# Patient Record
Sex: Female | Born: 1940 | Race: White | Hispanic: No | Smoking: Never smoker
Health system: Southern US, Community
[De-identification: ages and names within clinical notes are randomized; demographics above are authoritative.]

## PROBLEM LIST (undated history)

## (undated) DIAGNOSIS — R410 Disorientation, unspecified: Secondary | ICD-10-CM

## (undated) DIAGNOSIS — E7800 Pure hypercholesterolemia, unspecified: Secondary | ICD-10-CM

## (undated) DIAGNOSIS — R519 Headache, unspecified: Secondary | ICD-10-CM

## (undated) DIAGNOSIS — R51 Headache: Secondary | ICD-10-CM

## (undated) DIAGNOSIS — E039 Hypothyroidism, unspecified: Secondary | ICD-10-CM

## (undated) DIAGNOSIS — F329 Major depressive disorder, single episode, unspecified: Secondary | ICD-10-CM

## (undated) DIAGNOSIS — F32A Depression, unspecified: Secondary | ICD-10-CM

## (undated) HISTORY — DX: Depression, unspecified: F32.A

## (undated) HISTORY — PX: CATARACT EXTRACTION: SUR2

## (undated) HISTORY — DX: Pure hypercholesterolemia, unspecified: E78.00

## (undated) HISTORY — DX: Disorientation, unspecified: R41.0

## (undated) HISTORY — DX: Hypothyroidism, unspecified: E03.9

---
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
## 1999-07-12 ENCOUNTER — Other Ambulatory Visit: Admission: RE | Admit: 1999-07-12 | Discharge: 1999-07-12 | Payer: Self-pay | Admitting: Family Medicine

## 2001-11-13 ENCOUNTER — Other Ambulatory Visit: Admission: RE | Admit: 2001-11-13 | Discharge: 2001-11-13 | Payer: Self-pay | Admitting: Family Medicine

## 2002-05-18 ENCOUNTER — Ambulatory Visit (HOSPITAL_COMMUNITY): Admission: RE | Admit: 2002-05-18 | Discharge: 2002-05-18 | Payer: Self-pay | Admitting: Family Medicine

## 2002-05-18 ENCOUNTER — Encounter: Payer: Self-pay | Admitting: Family Medicine

## 2010-06-22 ENCOUNTER — Encounter: Admission: RE | Admit: 2010-06-22 | Discharge: 2010-06-22 | Payer: Self-pay | Admitting: Family Medicine

## 2011-04-18 ENCOUNTER — Other Ambulatory Visit: Payer: Self-pay | Admitting: Family Medicine

## 2011-04-18 DIAGNOSIS — M542 Cervicalgia: Secondary | ICD-10-CM

## 2011-04-21 ENCOUNTER — Ambulatory Visit
Admission: RE | Admit: 2011-04-21 | Discharge: 2011-04-21 | Disposition: A | Discharge status: Home / Self Care | Payer: Medicare Other | Source: Ambulatory Visit | Attending: Family Medicine | Admitting: Family Medicine

## 2011-04-21 DIAGNOSIS — M542 Cervicalgia: Secondary | ICD-10-CM

## 2013-12-02 ENCOUNTER — Other Ambulatory Visit: Payer: Self-pay | Admitting: Family Medicine

## 2013-12-02 ENCOUNTER — Ambulatory Visit
Admission: RE | Admit: 2013-12-02 | Discharge: 2013-12-02 | Disposition: A | Discharge status: Home / Self Care | Payer: Medicare Other | Source: Ambulatory Visit | Attending: Family Medicine | Admitting: Family Medicine

## 2013-12-02 DIAGNOSIS — M545 Low back pain, unspecified: Secondary | ICD-10-CM

## 2015-01-13 ENCOUNTER — Emergency Department (HOSPITAL_COMMUNITY): Payer: Medicare Other

## 2015-01-13 ENCOUNTER — Encounter (HOSPITAL_COMMUNITY): Payer: Self-pay

## 2015-01-13 ENCOUNTER — Emergency Department (HOSPITAL_COMMUNITY)
Admission: EM | Admit: 2015-01-13 | Discharge: 2015-01-13 | Disposition: A | Discharge status: Home / Self Care | Payer: Medicare Other | Attending: Emergency Medicine | Admitting: Emergency Medicine

## 2015-01-13 DIAGNOSIS — Y9389 Activity, other specified: Secondary | ICD-10-CM | POA: Insufficient documentation

## 2015-01-13 DIAGNOSIS — W19XXXA Unspecified fall, initial encounter: Secondary | ICD-10-CM

## 2015-01-13 DIAGNOSIS — W01198A Fall on same level from slipping, tripping and stumbling with subsequent striking against other object, initial encounter: Secondary | ICD-10-CM | POA: Insufficient documentation

## 2015-01-13 DIAGNOSIS — S0990XA Unspecified injury of head, initial encounter: Secondary | ICD-10-CM | POA: Diagnosis not present

## 2015-01-13 DIAGNOSIS — Y998 Other external cause status: Secondary | ICD-10-CM | POA: Insufficient documentation

## 2015-01-13 DIAGNOSIS — R42 Dizziness and giddiness: Secondary | ICD-10-CM | POA: Insufficient documentation

## 2015-01-13 DIAGNOSIS — F0781 Postconcussional syndrome: Secondary | ICD-10-CM | POA: Insufficient documentation

## 2015-01-13 DIAGNOSIS — Y9289 Other specified places as the place of occurrence of the external cause: Secondary | ICD-10-CM | POA: Diagnosis not present

## 2015-01-13 HISTORY — DX: Headache, unspecified: R51.9

## 2015-01-13 HISTORY — DX: Headache: R51

## 2015-01-13 LAB — CBG MONITORING, ED: Glucose-Capillary: 90 mg/dL (ref 70–99)

## 2015-01-13 MED ORDER — MECLIZINE HCL 12.5 MG PO TABS: 12.5000 mg | ORAL_TABLET | Freq: Two times a day (BID) | ORAL | Status: DC | PRN

## 2015-01-13 NOTE — ED Provider Notes (Signed)
I saw and evaluated the patient, reviewed the resident's note and I agree with the findings and plan.   EKG Interpretation None      Pt is a 74 y.o. female with no sciatica past medical history who is not on anticoagulation who presents to the emergency department with a mechanical fall yesterday morning when she slipped on ice and hit her head on semen. No loss of consciousness but has had posterior headache and felt "woozy" has had vertiginous symptoms with turning her head. She denies any vision loss, hearing changes, ear pain, tinnitus, numbness, tingling or focal weakness. CT of her head and cervical spine show no acute injury. She states she feels asymptomatic currently. Have advised her to use Tylenol over-the-counter for pain and will discharge with prescription for meclizine to use as needed for vertigo. Discussed with her postconcussive syndrome and the importance of outpatient follow-up. Discussed return precautions. She verbalized understanding and is comfortable with plan.  Cheryl MawKristen N Ward, DO 01/13/15 [phone]

## 2015-01-13 NOTE — ED Notes (Signed)
MD at bedside. 

## 2015-01-13 NOTE — ED Notes (Signed)
Checked patient cbg it was 90 notified RN of blood sugar 

## 2015-01-13 NOTE — ED Notes (Signed)
Pt here for fall yesterday morning and reports no issues then but today woke up and felt woozy and has headache and had intermitent vision issues

## 2015-01-13 NOTE — ED Provider Notes (Signed)
CSN: [id number]     Arrival date & time 01/13/15  0818 History   First MD Initiated Contact with Patient 01/13/15 0820     Chief Complaint  Patient presents with  . Fall     (Consider location/radiation/quality/duration/timing/severity/associated sxs/prior Treatment) HPI Comments: Pt was walking in yard yesterday and slipped on ice, landing on back of head. No LOC. Pt denies any significant pain or abnormality at that time and she was able to stand and was "fine" throughout the day. Not on blood thinners. This morning, pt turned in bed and noticed acute onset of dizziness, which resolved with time but then recurred when she turned the other direction. She subsequently was advised by her family to present to the ED. She does have a h/o HA but denies any prior vertigo or similar sx.  Patient is a 74 y.o. female presenting with fall.  Fall This is a new problem. The current episode started yesterday. The problem occurs constantly. The problem has been unchanged. Associated symptoms include vertigo. Pertinent negatives include no abdominal pain, chest pain, chills, congestion, coughing, diaphoresis, fever, headaches, nausea, neck pain, numbness, sore throat, vomiting or weakness. Exacerbated by: Turning head, changing positions. She has tried nothing for the symptoms. The treatment provided no relief.    Past Medical History  Diagnosis Date  . Headache    History reviewed. No pertinent past surgical history. History reviewed. No pertinent family history. History  Substance Use Topics  . Smoking status: Never Smoker   . Smokeless tobacco: Not on file  . Alcohol Use: No   OB History    No data available     Review of Systems  Constitutional: Negative for fever, chills and diaphoresis.  HENT: Negative for congestion, rhinorrhea and sore throat.   Eyes: Negative for visual disturbance.  Respiratory: Negative for cough, shortness of breath and wheezing.   Cardiovascular: Negative for  chest pain and leg swelling.  Gastrointestinal: Negative for nausea, vomiting and abdominal pain.  Genitourinary: Negative for dysuria.  Musculoskeletal: Negative for neck pain.  Neurological: Positive for dizziness, vertigo and speech difficulty (mildly slurred speech per family members). Negative for syncope, weakness, numbness and headaches.  Hematological: Does not bruise/bleed easily.      Allergies  Review of patient's allergies indicates no known allergies.  Home Medications   Prior to Admission medications   Not on File   BP 178/91 mmHg  Pulse 76  Temp(Src) 97.9 F (36.6 C) (Oral)  Ht  (1.6 m)  Wt 151 lb (68.493 kg)  BMI 26.76 kg/m2  SpO2 99% Physical Exam  Constitutional: She is oriented to person, place, and time. She appears well-developed and well-nourished. No distress.  HENT:  Head: Normocephalic.  Mouth/Throat: No oropharyngeal exudate.  No periorbital or periauricular ecchymoses. No battle's sign. No hemotympanum bilaterally. Scalp atraumatic.  Eyes: Conjunctivae are normal. Pupils are equal, round, and reactive to light. Right eye exhibits no discharge. Left eye exhibits no discharge.  Neck: Normal range of motion. Neck supple.  No midline or paraspinal TTP. No deformity or step-offs.  Cardiovascular: Normal rate and normal heart sounds.  Exam reveals no friction rub.   No murmur heard. Pulmonary/Chest: Effort normal and breath sounds normal. No respiratory distress. She has no wheezes. She has no rales.  Abdominal: Soft. She exhibits no distension. There is no tenderness.  Musculoskeletal: She exhibits no tenderness.  Neurological: She is alert and oriented to person, place, and time. She has normal strength. She is not disoriented.  She displays normal reflexes. No cranial nerve deficit or sensory deficit. She exhibits normal muscle tone. Coordination and gait normal. GCS eye subscore is 4. GCS verbal subscore is 5. GCS motor subscore is 6.  Nursing  note and vitals reviewed.   ED Course  Procedures (including critical care time) Labs Review Labs Reviewed  CBG MONITORING, ED    Imaging Review Ct Head Wo Contrast  01/13/2015   CLINICAL DATA:  74 year old female fell yesterday afternoon with posterior head injury. Awoke with dizziness and neck pain today. Initial encounter.  EXAM: CT HEAD WITHOUT CONTRAST  CT CERVICAL SPINE WITHOUT CONTRAST  TECHNIQUE: Multidetector CT imaging of the head and cervical spine was performed following the standard protocol without intravenous contrast. Multiplanar CT image reconstructions of the cervical spine were also generated.  COMPARISON:  Cervical spine MRI 04/21/2011.  FINDINGS: CT HEAD FINDINGS  Visualized paranasal sinuses and mastoids are clear. Left posterior scalp soft tissue contusion series 202, image 42. Underlying calvarium intact. No acute osseous abnormality identified. Negative orbits soft tissues.  Mild Calcified atherosclerosis at the skull base. No ventriculomegaly. Mild lateral ventricle asymmetry felt to be normal anatomic variation. No acute intracranial hemorrhage identified. No evidence of cortically based acute infarction identified. Normal for age gray-white matter differentiation. No suspicious intracranial vascular hyperdensity.  CT CERVICAL SPINE FINDINGS  Stable cervical vertebral height and alignment. Visualized skull base is intact. No atlanto-occipital dissociation. Cervicothoracic junction alignment is within normal limits. Bilateral posterior element alignment is within normal limits. No acute cervical spine fracture identified. Grossly intact visualized upper thoracic levels.  Mild nonspecific ground-glass opacity in the right lung apex. Negative non contrast paraspinal soft tissues.  IMPRESSION: 1. Left posterior scalp soft tissue injury without underlying fracture. 2. Negative for age noncontrast CT appearance of the brain. 3. No acute fracture or listhesis identified in the  cervical spine. Ligamentous injury is not excluded.   Electronically Signed   By: Odessa Fleming M.D.   On: 01/13/2015 09:10   Ct Cervical Spine Wo Contrast  01/13/2015   CLINICAL DATA:  74 year old female fell yesterday afternoon with posterior head injury. Awoke with dizziness and neck pain today. Initial encounter.  EXAM: CT HEAD WITHOUT CONTRAST  CT CERVICAL SPINE WITHOUT CONTRAST  TECHNIQUE: Multidetector CT imaging of the head and cervical spine was performed following the standard protocol without intravenous contrast. Multiplanar CT image reconstructions of the cervical spine were also generated.  COMPARISON:  Cervical spine MRI 04/21/2011.  FINDINGS: CT HEAD FINDINGS  Visualized paranasal sinuses and mastoids are clear. Left posterior scalp soft tissue contusion series 202, image 42. Underlying calvarium intact. No acute osseous abnormality identified. Negative orbits soft tissues.  Mild Calcified atherosclerosis at the skull base. No ventriculomegaly. Mild lateral ventricle asymmetry felt to be normal anatomic variation. No acute intracranial hemorrhage identified. No evidence of cortically based acute infarction identified. Normal for age gray-white matter differentiation. No suspicious intracranial vascular hyperdensity.  CT CERVICAL SPINE FINDINGS  Stable cervical vertebral height and alignment. Visualized skull base is intact. No atlanto-occipital dissociation. Cervicothoracic junction alignment is within normal limits. Bilateral posterior element alignment is within normal limits. No acute cervical spine fracture identified. Grossly intact visualized upper thoracic levels.  Mild nonspecific ground-glass opacity in the right lung apex. Negative non contrast paraspinal soft tissues.  IMPRESSION: 1. Left posterior scalp soft tissue injury without underlying fracture. 2. Negative for age noncontrast CT appearance of the brain. 3. No acute fracture or listhesis identified in the cervical spine. Ligamentous  injury is not excluded.   Electronically Signed   By: Odessa FlemingH  Hall M.D.   On: 01/13/2015 09:10     EKG Interpretation None      MDM   Final diagnoses:  Fall, initial encounter  Dizziness  Post concussive syndrome    74 yo F with PMHx of chronic HA who presents with positional dizziness and subjective slurred speech s/p fall from standing yesterday. See HPI above. On arrival, T 97.66F, HR 76, BP 178/91, satting 99% on RA. Exam as above, pt overall well-appearing in NAD. No scalp hematoma, no signs of head trauma, and neuro exam is non-focal without abnormality.  Pt's presentation most c/f possible SDH, SAH, or other acute traumatic ICH. Will send for CT Head and C-Spine given fall. No other signs of trauma. No Battle's sign, periorbital ecchymoses, hemotympanum, or evidence of basilar skull fx. No rhinorrhea, otorrhea, or signs of CSF leak. TMs clear bilaterally without abnormality - no signs of AOM, OE, or mastoiditis. DDx includes BPPV with dislodgment of otoliths 2/2 fall, though this is dx of exclusion. Neuro exam non-focal without evidence of mass effect or CVA. Will f/u CT and re-assess. BP mildly elevated, will monitor. Not on blood thinners. Fall was mechanical in nature 2/2 slipping on ice and she denies any CP, SOB, or palpitations. VSS.   CT Head/C-Spine negative for acute abnormality, specifically no evidence of cranial fracture or traumatic bleed. Pt asymptomatic and remains neurologically intact at this time. Suspect mild post-concussive syndrome. Will Rx meclizine PRN with tylenol for HA. Strict return precautions discussed in detail. Pt and family in agreement with this plan.  Clinical Impression: 1. Fall, initial encounter   2. Dizziness   3. Post concussive syndrome     Disposition: Discharge  Condition: Good  I have discussed the results, Dx and Tx plan with the pt(& family if present). He/she/they expressed understanding and agree(s) with the plan. Discharge instructions  discussed at great length. Strict return precautions discussed and pt &/or family have verbalized understanding of the instructions. No further questions at time of discharge.    New Prescriptions   MECLIZINE (ANTIVERT) 12.5 MG TABLET    Take 1 tablet (12.5 mg total) by mouth 2 (two) times daily as needed for dizziness.    [facility] [address]  [place] [id number] [phone]  Follow-up with your PCP in 2-3 days. If you do not have a PCP, call this number to set up an appointment.   Pt seen in conjunction with Dr. Nikki DomWard    Carlas Vandyne, MD 01/13/15 [phone]

## 2015-01-13 NOTE — ED Notes (Signed)
Patient transported to CT 

## 2015-01-13 NOTE — ED Notes (Signed)
Checked patient eyes it read 20/70 both eyes left eye 20/70 right eye 20/70

## 2016-06-03 ENCOUNTER — Other Ambulatory Visit: Payer: Self-pay | Admitting: Family Medicine

## 2016-06-03 DIAGNOSIS — Z1231 Encounter for screening mammogram for malignant neoplasm of breast: Secondary | ICD-10-CM

## 2016-06-08 ENCOUNTER — Ambulatory Visit
Admission: RE | Admit: 2016-06-08 | Discharge: 2016-06-08 | Disposition: A | Discharge status: Home / Self Care | Payer: Medicare Other | Source: Ambulatory Visit | Attending: Family Medicine | Admitting: Family Medicine

## 2016-06-08 DIAGNOSIS — Z1231 Encounter for screening mammogram for malignant neoplasm of breast: Secondary | ICD-10-CM

## 2018-09-06 ENCOUNTER — Other Ambulatory Visit: Payer: Self-pay

## 2018-09-06 ENCOUNTER — Emergency Department (HOSPITAL_COMMUNITY): Payer: Medicare Other

## 2018-09-06 ENCOUNTER — Emergency Department (HOSPITAL_COMMUNITY)
Admission: EM | Admit: 2018-09-06 | Discharge: 2018-09-06 | Disposition: A | Discharge status: Home / Self Care | Payer: Medicare Other | Attending: Emergency Medicine | Admitting: Emergency Medicine

## 2018-09-06 ENCOUNTER — Encounter (HOSPITAL_COMMUNITY): Payer: Self-pay

## 2018-09-06 DIAGNOSIS — Z79899 Other long term (current) drug therapy: Secondary | ICD-10-CM | POA: Diagnosis not present

## 2018-09-06 DIAGNOSIS — R0789 Other chest pain: Secondary | ICD-10-CM | POA: Diagnosis present

## 2018-09-06 DIAGNOSIS — R079 Chest pain, unspecified: Secondary | ICD-10-CM

## 2018-09-06 LAB — HEPATIC FUNCTION PANEL
ALT: 14 U/L (ref 0–44)
AST: 22 U/L (ref 15–41)
Albumin: 3.4 g/dL — ABNORMAL LOW (ref 3.5–5.0)
Alkaline Phosphatase: 67 U/L (ref 38–126)
BILIRUBIN DIRECT: 0.2 mg/dL (ref 0.0–0.2)
BILIRUBIN INDIRECT: 0.3 mg/dL (ref 0.3–0.9)
Total Bilirubin: 0.5 mg/dL (ref 0.3–1.2)
Total Protein: 6 g/dL — ABNORMAL LOW (ref 6.5–8.1)

## 2018-09-06 LAB — LIPASE, BLOOD: LIPASE: 21 U/L (ref 11–51)

## 2018-09-06 LAB — CBC
HCT: 43.2 % (ref 36.0–46.0)
Hemoglobin: 13.8 g/dL (ref 12.0–15.0)
MCH: 29.2 pg (ref 26.0–34.0)
MCHC: 31.9 g/dL (ref 30.0–36.0)
MCV: 91.5 fL (ref 80.0–100.0)
NRBC: 0 % (ref 0.0–0.2)
PLATELETS: 172 10*3/uL (ref 150–400)
RBC: 4.72 MIL/uL (ref 3.87–5.11)
RDW: 12.9 % (ref 11.5–15.5)
WBC: 6.8 10*3/uL (ref 4.0–10.5)

## 2018-09-06 LAB — BASIC METABOLIC PANEL
ANION GAP: 9 (ref 5–15)
BUN: 12 mg/dL (ref 8–23)
CHLORIDE: 106 mmol/L (ref 98–111)
CO2: 26 mmol/L (ref 22–32)
CREATININE: 0.82 mg/dL (ref 0.44–1.00)
Calcium: 8.4 mg/dL — ABNORMAL LOW (ref 8.9–10.3)
GFR calc non Af Amer: >60 mL/min (ref >60)
Glucose, Bld: 143 mg/dL — ABNORMAL HIGH (ref 70–99)
Potassium: 3.7 mmol/L (ref 3.5–5.1)
Sodium: 141 mmol/L (ref 135–145)

## 2018-09-06 LAB — I-STAT TROPONIN, ED
Troponin i, poc: 0 ng/mL (ref 0.00–0.08)
Troponin i, poc: 0 ng/mL (ref 0.00–0.08)

## 2018-09-06 MED ORDER — GI COCKTAIL ~~LOC~~
30.0000 mL | Freq: Once | ORAL | Status: AC
  Administered 2018-09-06: 30 mL via ORAL
  Filled 2018-09-06: qty 30

## 2018-09-06 MED ORDER — OMEPRAZOLE 20 MG PO CPDR: 20.0000 mg | DELAYED_RELEASE_CAPSULE | Freq: Every day | ORAL | 0 refills | Status: DC

## 2018-09-06 MED ORDER — FAMOTIDINE 20 MG PO TABS
20.0000 mg | ORAL_TABLET | Freq: Once | ORAL | Status: AC
  Administered 2018-09-06: 20 mg via ORAL
  Filled 2018-09-06: qty 1

## 2018-09-06 NOTE — Discharge Instructions (Signed)
Read instructions below for reasons to return to the Emergency Department. It is recommended that your follow up with your Primary Care Doctor in regards to today's visit. If you do not have a doctor, use the resource guide listed below to help you find one.  Begin taking over the counter Prilosec or Zegrid (omeprazole) as directed.   Tests performed today include: An EKG of your heart A chest x-ray Cardiac enzymes - a blood test for heart muscle damage Blood counts and electrolytes Vital signs. See below for your results today.   Chest Pain (Nonspecific)  HOME CARE INSTRUCTIONS  For the next few days, avoid physical activities that bring on chest pain. Continue physical activities as directed.  Do not smoke cigarettes or drink alcohol until your symptoms are gone. If you do smoke, it is time to quit. You may receive instructions and counseling on how to stop smoking. Only take over-the-counter or prescription medicine for pain, discomfort, or fever as directed by your caregiver.  Follow your caregiver's suggestions for further testing if your chest pain does not go away.  Keep any follow-up appointments you made. If you do not go to an appointment, you could develop lasting (chronic) problems with pain. If there is any problem keeping an appointment, you must call to reschedule.  SEEK MEDICAL CARE IF:  You think you are having problems from the medicine you are taking. Read your medicine instructions carefully.  Your chest pain does not go away, even after treatment.  You develop a rash with blisters on your chest.  SEEK IMMEDIATE MEDICAL CARE IF:  You have increased chest pain or pain that spreads to your arm, neck, jaw, back, or belly (abdomen).  You develop shortness of breath, an increasing cough, or you are coughing up blood.  You have severe back or abdominal pain, feel sick to your stomach (nauseous) or throw up (vomit).  You develop severe weakness, fainting, or chills.  You have an  oral temperature above 102 F (38.9 C), not controlled by medicine.  THIS IS AN EMERGENCY. Do not wait to see if the pain will go away. Get medical help at once. Call your local emergency services (911 in [place] Do not drive yourself to the hospital. Additional Information:  Your vital signs today were: BP (!) 154/73    Pulse 68    Temp 98.2 F (36.8 C) (Oral)    Resp 14    SpO2 94%  If your blood pressure (BP) was elevated above 135/85 this visit, please have this repeated by your doctor within one month. ---------------

## 2018-09-06 NOTE — ED Notes (Signed)
Patient verbalizes understanding of discharge instructions. Opportunity for questioning and answers were provided. Armband removed by staff, pt discharged from ED.  

## 2018-09-06 NOTE — ED Provider Notes (Signed)
Medical screening examination/treatment/procedure(s) were conducted as a shared visit with non-physician practitioner(s) and myself.  I personally evaluated the patient during the encounter.  EKG Interpretation  Date/Time:  Thursday September 06 2018 08:30:55 EDT Ventricular Rate:  69 PR Interval:    QRS Duration: 94 QT Interval:  414 QTC Calculation: 444 R Axis:   14 Text Interpretation:  Sinus rhythm normal.no old comparison Confirmed by Arby Barrette [phone]) on 09/06/2018 8:36:41 AM Also confirmed by Arby Barrette [phone]), editor Elita Quick ([id number])  on 09/06/2018 9:22:21 AM  Patient had prepared some tater tots for breakfast this morning.  She ate them and then when standing up suddenly felt a very uncomfortable full and pressure like sensation in her epigastrium\lower chest.  She reports she was not sure if something had gotten "stuck".  Or she was having a heart attack.  Waited about an hour in the improved.  She called EMS.  She was given aspirin which she reports she did not have difficulty swallowing.  She was given nitroglycerin.  She reports the symptoms gradually started going away.  She reports now she just has a slight achy feeling, she indicates the epigastrium.  Patient is alert, nontoxic and well in appearance.  Heart normal.  Lungs normal.  Abdomen soft and nontender.  No lower extremity edema.  Patient clinically well.  Symptoms sound suggestive of GI etiology.  Agree with 2 sets of troponins if negative without ischemic changes on EKG, stable for outpatient follow-up.   Arby Barrette, MD 09/08/18 [phone]

## 2018-09-06 NOTE — ED Triage Notes (Signed)
Pt presents for evaluation of chest pain starting during breakfast. Pt received one nitro and 324 asa in route with resolution of pain.

## 2018-09-06 NOTE — ED Provider Notes (Signed)
[facility] EMERGENCY DEPARTMENT Provider Note   CSN: [id number] Arrival date & time: 09/06/18  7829     History   Chief Complaint Chief Complaint  Patient presents with  . Chest Pain    HPI Cheryl Mcdonald is a 77 y.o. female with a reported history of hyperlipidemia (states that she is supposed to be on medication for her cholesterol) who presents emergency department today for chest pain.  Patient reports around 7 AM this morning she finished eating her breakfast, tater tots.  She stood up and felt a knot that was substernal and did not radiate to either side of her chest, to her back, neck, jaw or either shoulder.  There is no associated nausea, vomiting, shortness of breath, diaphoresis.  She has never had a pain like this before.  Denies any abdominal pain, burping, belching.  Her pain lasted for approximately 1 hour before EMS arrival.  She notes that she was able to walk around without worsening or improvement of pain.  Pain was not exertional, positional or pleuritic in nature.  On route patient received nitroglycerin and aspirin with relief of her symptoms.  She is now chest pain-free.  Chest pain is not recurred.  She denies any history of hypertension, diabetes.  No history of MI, CVA or TIA in the past.  No prior cardiac work-up.  No family history of early CVA.  HPI  Past Medical History:  Diagnosis Date  . Headache     There are no active problems to display for this patient.   History reviewed. No pertinent surgical history.   OB History   None      Home Medications    Prior to Admission medications   Medication Sig Start Date End Date Taking? Authorizing Provider  diphenhydramine-acetaminophen (TYLENOL PM) 25-500 MG TABS Take 2 tablets by mouth at bedtime as needed.    [provider]  meclizine (ANTIVERT) 12.5 MG tablet Take 1 tablet (12.5 mg total) by mouth 2 (two) times daily as needed for dizziness. 01/13/15   Shaune Pollack,  MD  PARoxetine (PAXIL) 20 MG tablet Take 40 mg by mouth daily.    [provider]  Vitamin D, Ergocalciferol, (DRISDOL) 50000 UNITS CAPS capsule Take 50,000 Units by mouth every 14 (fourteen) days.    [provider]    Family History No family history on file.  Social History Social History   Tobacco Use  . Smoking status: Never Smoker  Substance Use Topics  . Alcohol use: No  . Drug use: No     Allergies   Patient has no known allergies.   Review of Systems Review of Systems  All other systems reviewed and are negative.    Physical Exam Updated Vital Signs BP (!) 157/78   Pulse 74   Temp 98.2 F (36.8 C) (Oral)   Resp (!) 23   SpO2 97%   Physical Exam  Constitutional: She appears well-developed and well-nourished.  HENT:  Head: Normocephalic and atraumatic.  Right Ear: External ear normal.  Left Ear: External ear normal.  Nose: Nose normal.  Mouth/Throat: Uvula is midline, oropharynx is clear and moist and mucous membranes are normal. No tonsillar exudate.  Eyes: Pupils are equal, round, and reactive to light. Right eye exhibits no discharge. Left eye exhibits no discharge. No scleral icterus.  Neck: Trachea normal. Neck supple. No spinous process tenderness present. No neck rigidity. Normal range of motion present.  Cardiovascular: Normal rate, regular rhythm and  intact distal pulses.  No murmur heard. Pulses:      Radial pulses are 2+ on the right side, and 2+ on the left side.       Dorsalis pedis pulses are 2+ on the right side, and 2+ on the left side.       Posterior tibial pulses are 2+ on the right side, and 2+ on the left side.  No lower extremity swelling or edema. Calves symmetric in size bilaterally.  Pulmonary/Chest: Effort normal and breath sounds normal. She exhibits no tenderness.  Abdominal: Soft. Bowel sounds are normal. There is no tenderness. There is no rigidity, no rebound, no guarding, no CVA tenderness and negative  Murphy's sign.  Musculoskeletal: She exhibits no edema.  Lymphadenopathy:    She has no cervical adenopathy.  Neurological: She is alert.  Skin: Skin is warm and dry. No rash noted. She is not diaphoretic.  Psychiatric: She has a normal mood and affect.  Nursing note and vitals reviewed.    ED Treatments / Results  Labs (all labs ordered are listed, but only abnormal results are displayed) Labs Reviewed  BASIC METABOLIC PANEL - Abnormal; Notable for the following components:      Result Value   Glucose, Bld 143 (*)    Calcium 8.4 (*)    All other components within normal limits  HEPATIC FUNCTION PANEL - Abnormal; Notable for the following components:   Total Protein 6.0 (*)    Albumin 3.4 (*)    All other components within normal limits  CBC  LIPASE, BLOOD  I-STAT TROPONIN, ED  I-STAT TROPONIN, ED    EKG EKG Interpretation  Date/Time:  Thursday September 06 2018 08:30:55 EDT Ventricular Rate:  69 PR Interval:    QRS Duration: 94 QT Interval:  414 QTC Calculation: 444 R Axis:   14 Text Interpretation:  Sinus rhythm normal.no old comparison Confirmed by Arby Barrette [phone]) on 09/06/2018 8:36:41 AM   Radiology Dg Chest 2 View  Result Date: 09/06/2018 CLINICAL DATA:  Chest pain EXAM: CHEST - 2 VIEW COMPARISON:  None. FINDINGS: Lungs are clear. The heart size and pulmonary vascularity are normal. No adenopathy. There is a focal hiatal hernia. There is aortic atherosclerosis. There is mild degenerative change in the thoracic spine. IMPRESSION: Focal hiatal hernia. Aortic atherosclerosis evident. No edema or consolidation. Aortic Atherosclerosis (ICD10-I70.0). Electronically Signed   By: Bretta Bang III M.D.   On: 09/06/2018 09:10    Procedures Procedures (including critical care time)  Medications Ordered in ED Medications  gi cocktail (Maalox,Lidocaine,Donnatal) (30 mLs Oral Given 09/06/18 1002)  famotidine (PEPCID) tablet 20 mg (20 mg Oral Given 09/06/18  1002)     Initial Impression / Assessment and Plan / ED Course  I have reviewed the triage vital signs and the nursing notes.  Pertinent labs & imaging results that were available during my care of the patient were reviewed by me and considered in my medical decision making (see chart for details).     77 y.o. female with a reported history of hyperlipidemia (states that she is supposed to be on medication for her cholesterol) who presents emergency department today for chest pain that was brought on after eating tater tots and was a knot-like feeling in the sub-sternum of her chest and was not associated with nausea, vomiting, shortness of breath or diaphoresis.  Patient's pain was nonexertional in nature.  It was relieved after nitroglycerin by EMS and has not recurred.   Patient is to be  discharged with recommendation to follow up with PCP in regards to today's hospital visit. EKG without acute abnormalities, negative troponin x 2, and negative CXR. HEART score is 3. Will start patient on PPI. Patient has been advised to return to the ED if chest pain becomes exertional, associated with diaphoresis or nausea, radiates to left jaw/arm, worsens or becomes concerning in any way. Patient appears reliable for follow up and is agreeable to discharge. I advised the patient to follow-up with their primary care provider this week. I advised the patient to return to the emergency department with new or worsening symptoms or new concerns. The patient verbalized understanding and agreement with plan. Patient stable for discharge. She is ambulatory in the department without chest pain.  This patient was discussed with Dr. Donnald Garre who is in agreement with assessment and plan.   Final Clinical Impressions(s) / ED Diagnoses   Final diagnoses:  Central chest pain    ED Discharge Orders    None       Princella Pellegrini 09/06/18 1540    Arby Barrette, MD 09/08/18 [phone]

## 2019-07-11 ENCOUNTER — Ambulatory Visit (INDEPENDENT_AMBULATORY_CARE_PROVIDER_SITE_OTHER): Payer: Medicare Other | Admitting: Neurology

## 2019-07-11 ENCOUNTER — Other Ambulatory Visit: Payer: Self-pay

## 2019-07-11 ENCOUNTER — Encounter: Payer: Self-pay | Admitting: Neurology

## 2019-07-11 VITALS — BP 121/72 | HR 81 | Temp 97.7°F | Wt 135.0 lb

## 2019-07-11 DIAGNOSIS — G4452 New daily persistent headache (NDPH): Secondary | ICD-10-CM | POA: Diagnosis not present

## 2019-07-11 DIAGNOSIS — G44209 Tension-type headache, unspecified, not intractable: Secondary | ICD-10-CM | POA: Diagnosis not present

## 2019-07-11 DIAGNOSIS — G4440 Drug-induced headache, not elsewhere classified, not intractable: Secondary | ICD-10-CM

## 2019-07-11 MED ORDER — DIVALPROEX SODIUM 250 MG PO DR TAB: 250.0000 mg | DELAYED_RELEASE_TABLET | Freq: Two times a day (BID) | ORAL | 3 refills | Status: DC

## 2019-07-11 NOTE — Patient Instructions (Signed)
I had a long discussion with the patient and her friend regarding her new onset daily persistent headache which seem like tension headaches with medication overuse headache.  Recommend she stop taking ibuprofen every day as well as limit taking tramadol to not more than couple of days per week.  Increased dose of Depakote DR to 250 mg twice daily for headache prophylaxis.  I also recommend she do regular neck stretching exercises as well as increase participation in regular activities for stress relaxation like regular walking, exercise, meditation and yoga.  She will return for follow-up in the future in 2 months or call earlier if necessary.  Analgesic Rebound Headache An analgesic rebound headache, sometimes called a medication overuse headache, is a headache that comes after pain medicine (analgesic) taken to treat the original (primary) headache has worn off. Any type of primary headache can return as a rebound headache if a person regularly takes analgesics more than three times a week to treat it. The types of primary headaches that are commonly associated with rebound headaches include:  Migraines.  Headaches that arise from tense muscles in the head and neck area (tension headaches).  Headaches that develop and happen again (recur) on one side of the head and around the eye (cluster headaches). If rebound headaches continue, they become chronic daily headaches. What are the causes? This condition may be caused by frequent use of:  Over-the-counter medicines such as aspirin, ibuprofen, and acetaminophen.  Sinus relief medicines and other medicines that contain caffeine.  Narcotic pain medicines such as codeine and oxycodone. What are the signs or symptoms? The symptoms of a rebound headache are the same as the symptoms of the original headache. Some of the symptoms of specific types of headaches include: Migraine headache  Pulsing or throbbing pain on one or both sides of the head.   Severe pain that interferes with daily activities.  Pain that is worsened by physical activity.  Nausea, vomiting, or both.  Pain with exposure to bright light, loud noises, or strong smells.  General sensitivity to bright light, loud noises, or strong smells.  Visual changes.  Numbness of one or both arms. Tension headache  Pressure around the head.  Dull, aching head pain.  Pain felt over the front and sides of the head.  Tenderness in the muscles of the head, neck, and shoulders. Cluster headache  Severe pain that begins in or around one eye or temple.  Redness and tearing in the eye on the same side as the pain.  Droopy or swollen eyelid.  One-sided head pain.  Nausea.  Runny nose.  Sweaty, pale facial skin.  Restlessness. How is this diagnosed? This condition is diagnosed by:  Reviewing your medical history. This includes the nature of your primary headaches.  Reviewing the types of pain medicines that you have been using to treat your headaches and how often you take them. How is this treated? This condition may be treated or managed by:  Discontinuing frequent use of the analgesic medicine. Doing this may worsen your headaches at first, but the pain should eventually become more manageable, less frequent, and less severe.  Seeing a headache specialist. He or she may be able to help you manage your headaches and help make sure there is not another cause of the headaches.  Using methods of stress relief, such as acupuncture, counseling, biofeedback, and massage. Talk with your health care provider about which methods might be good for you. Follow these instructions at home:  Take over-the-counter and prescription medicines only as told by your health care provider.  Stop the repeated use of pain medicine as told by your health care provider. Stopping can be difficult. Carefully follow instructions from your health care provider.  Avoid triggers that are  known to cause your primary headaches.  Keep all follow-up visits as told by your health care provider. This is important. Contact a health care provider if:  You continue to experience headaches after following treatments that your health care provider recommended. Get help right away if:  You develop new headache pain.  You develop headache pain that is different than what you have experienced in the past.  You develop numbness or tingling in your arms or legs.  You develop changes in your speech or vision. This information is not intended to replace advice given to you by your health care provider. Make sure you discuss any questions you have with your health care provider. Document Released: 02/04/2004 Document Revised: 10/27/2017 Document Reviewed: 04/18/2016 Elsevier Patient Education  2020 [employer].

## 2019-07-11 NOTE — Progress Notes (Signed)
[facility] [address] [place]. [place] [address] [phone]       OFFICE CONSULT NOTE  [name] Date of Birth:  1941/03/31 Medical Record Number:  [id number]   Referring MD: Claris Gower  Reason for Referral: Headache HPI: Cheryl Mcdonald is a 78 year old pleasant Caucasian lady who states she has had new onset daily persistent headache for the last 4 weeks.  She woke up and noticed severe 10/10 headache which was bifrontal and throbbing and incapacitating.  She felt nauseous and threw up.  Since then she has had daily headaches which are mostly in the morning when she wakes up.  She has been taking ibuprofen as well as tramadol every day.  She is vague when asked about the exact number of tablets she has been taking.  She states that the medications put her to sleep and she feels fine but when she wakes up the headache is still there.  She denies any light sensitivity, sound sensitivity.  There are no specific triggers or relieving factors for the headaches except sleep.  She has not had any brain imaging study that I can see but states that she did [facility] and was told by Dr. Arelia Sneddon that it was fine.  I do not have either the disc of the report to review today.  She denies any focal accompanying symptoms in the form of slurred speech, loss of vision, blurred vision, vertigo, gait balance difficulties or weakness.  She does have remote history of migraine headaches but feels these headaches are different and they have persisted daily.  She has no other past neurological history of stroke, TIA, seizures, significant head injury or loss of consciousness.  She has been taking Depakote DR 125 mg tablets 1 or 2 tablets at night for sleep and not for  headache prevention ROS:   14 system review of systems is positive for headache only and all other systems negative PMH:  Past Medical History:  Diagnosis Date  . Headache      Social History:  Social History   Socioeconomic History  . Marital status: Widowed    Spouse name: Not on file  . Number of children: Not on file  . Years of education: Not on file  . Highest education level: Not on file  Occupational History  . Not on file  Social Needs  . Financial resource strain: Not on file  . Food insecurity    Worry: Not on file    Inability: Not on file  . Transportation needs    Medical: Not on file    Non-medical: Not on file  Tobacco Use  . Smoking status: Never Smoker  . Smokeless tobacco: Never Used  Substance and Sexual Activity  . Alcohol use: No  . Drug use: No  . Sexual activity: Not on file  Lifestyle  . Physical activity    Days per week: Not on file    Minutes per session: Not on file  . Stress: Not on file  Relationships  . Social Herbalist on phone: Not on file    Gets together: Not on file    Attends religious service: Not on file    Active member of club or organization: Not on file    Attends meetings of clubs or organizations: Not on file    Relationship status: Not on file  . Intimate partner violence    Fear of  current or ex partner: Not on file    Emotionally abused: Not on file    Physically abused: Not on file    Forced sexual activity: Not on file  Other Topics Concern  . Not on file  Social History Narrative  . Not on file    Medications:   Current Outpatient Medications on File Prior to Visit  Medication Sig Dispense Refill  . diphenhydramine-acetaminophen (TYLENOL PM) 25-500 MG TABS Take 2 tablets by mouth at bedtime as needed.    Marland Kitchen. levothyroxine (SYNTHROID) 25 MCG tablet TAKE 1 TABLET BY MOUTH ONCE DAILY IN THE MORNING    . lovastatin (MEVACOR) 20 MG tablet Take 20 mg by mouth daily.    Marland Kitchen. omeprazole (PRILOSEC) 20 MG capsule Take 1 capsule (20 mg total) by mouth daily. 30 capsule 0  . ondansetron (ZOFRAN) 8 MG tablet Take by mouth every 8 (eight) hours as needed for nausea or vomiting.    Marland Kitchen.  PARoxetine (PAXIL) 20 MG tablet Take 40 mg by mouth daily.    . traMADol (ULTRAM) 50 MG tablet Take by mouth every 6 (six) hours as needed (takes for pain and headaches).    . traZODone (DESYREL) 150 MG tablet TAKE 1 3 TO 1 TABLET BY MOUTH AT BEDTIME AS NEEDED FOR INSOMNIA     No current facility-administered medications on file prior to visit.     Allergies:  No Known Allergies  Physical Exam General: well developed, well nourished elderly Caucasian lady, seated, in no evident distress Head: head normocephalic and atraumatic.   Neck: supple with no carotid or supraclavicular bruits Cardiovascular: regular rate and rhythm, no murmurs Musculoskeletal: no deformity Skin:  no rash/petichiae Vascular:  Normal pulses all extremities  Neurologic Exam Mental Status: Awake and fully alert. Oriented to place and time. Recent and remote memory intact. Attention span, concentration and fund of knowledge appropriate. Mood and affect appropriate.  Cranial Nerves: Fundoscopic exam reveals sharp disc margins. Pupils equal, briskly reactive to light. Extraocular movements full without nystagmus. Visual fields full to confrontation. Hearing intact. Facial sensation intact. Face, tongue, palate moves normally and symmetrically.  Motor: Normal bulk and tone. Normal strength in all tested extremity muscles. Sensory.: intact to touch , pinprick , position and vibratory sensation.  Coordination: Rapid alternating movements normal in all extremities. Finger-to-nose and heel-to-shin performed accurately bilaterally. Gait and Station: Arises from chair without difficulty. Stance is normal. Gait demonstrates normal stride length and balance . Able to heel, toe and tandem walk with moderate  difficulty.  Reflexes: 1+ and symmetric. Toes downgoing.       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  Nonfocal  neurological exam     PLAN: I had a long discussion with the patient and her friend regarding her new onset daily persistent headache which seem like tension headaches with medication overuse headache.  Recommend she stop taking ibuprofen every day as well as limit taking tramadol to not more than couple of days per week.  Increased dose of Depakote DR to 250 mg twice daily for headache prophylaxis.  I also recommend she do regular neck stretching exercises as well as increase participation in regular activities for stress relaxation like regular walking, exercise, meditation and yoga.  Greater than 50% time during this 45-minute consult visit was spent on counseling and coordination of care about her medication overuse headaches and answering questions she will return for follow-up in the future  in 2 months or call earlier if necessary. Delia HeadyPramod Enmanuel Zufall, MD  [facility] [address] [place], [place] [id number]  Phone [phone] Fax [phone] Note: This document was prepared with digital dictation and possible smart phrase technology. Any transcriptional errors that result from this process are unintentional.

## 2019-07-23 ENCOUNTER — Telehealth: Payer: Self-pay

## 2019-07-23 NOTE — Telephone Encounter (Signed)
MRi brain results given to Dr. Leonie Man.

## 2019-07-23 NOTE — Telephone Encounter (Addendum)
I called pt to give her Dr Leonie Man results. 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 PT verbalized understanding.

## 2019-07-23 NOTE — Telephone Encounter (Signed)
revised 

## 2019-07-23 NOTE — Telephone Encounter (Signed)
Kindly inform patient that MRI brain was unremarkable. 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 Nothing to worry about.

## 2019-09-17 ENCOUNTER — Ambulatory Visit: Payer: Medicare Other | Admitting: Neurology

## 2019-12-10 ENCOUNTER — Encounter: Payer: Self-pay | Admitting: Neurology

## 2019-12-10 ENCOUNTER — Other Ambulatory Visit: Payer: Self-pay

## 2019-12-10 ENCOUNTER — Ambulatory Visit (INDEPENDENT_AMBULATORY_CARE_PROVIDER_SITE_OTHER): Payer: Medicare Other | Admitting: Neurology

## 2019-12-10 VITALS — BP 140/96 | HR 92 | Temp 97.1°F | Wt 154.0 lb

## 2019-12-10 DIAGNOSIS — R519 Headache, unspecified: Secondary | ICD-10-CM

## 2019-12-10 DIAGNOSIS — G4452 New daily persistent headache (NDPH): Secondary | ICD-10-CM | POA: Insufficient documentation

## 2019-12-10 MED ORDER — DIVALPROEX SODIUM 500 MG PO DR TAB: 500.0000 mg | DELAYED_RELEASE_TABLET | Freq: Two times a day (BID) | ORAL | 2 refills | Status: DC

## 2019-12-10 MED ORDER — TOPIRAMATE 50 MG PO TABS: 25.0000 mg | ORAL_TABLET | Freq: Two times a day (BID) | ORAL | 2 refills | Status: DC

## 2019-12-10 NOTE — Patient Instructions (Signed)
I had a long discussion with the patient and her granddaughter regarding her daily persistent headache and agree with increasing the dose of Depakote ER to 500 mg  twice daily as well as a trial of Topamax 25 mg twice daily for 2 weeks to be increased further as tolerated.  I also recommend she do regular neck stretching exercises.  I will also refer the patient to Dr. Jaynee Eagles headache specialist in a practice for her opinion as well.  She will return for follow-up in the future as needed.  Neck Exercises Ask your health care provider which exercises are safe for you. Do exercises exactly as told by your health care provider and adjust them as directed. It is normal to feel mild stretching, pulling, tightness, or discomfort as you do these exercises. Stop right away if you feel sudden pain or your pain gets worse. Do not begin these exercises until told by your health care provider. Neck exercises can be important for many reasons. They can improve strength and maintain flexibility in your neck, which will help your upper back and prevent neck pain. Stretching exercises Rotation neck stretching  1. Sit in a chair or stand up. 2. Place your feet flat on the floor, shoulder width apart. 3. Slowly turn your head (rotate) to the right until a slight stretch is felt. Turn it all the way to the right so you can look over your right shoulder. Do not tilt or tip your head. 4. Hold this position for 10-30 seconds. 5. Slowly turn your head (rotate) to the left until a slight stretch is felt. Turn it all the way to the left so you can look over your left shoulder. Do not tilt or tip your head. 6. Hold this position for 10-30 seconds. Repeat __________ times. Complete this exercise __________ times a day. Neck retraction 1. Sit in a sturdy chair or stand up. 2. Look straight ahead. Do not bend your neck. 3. Use your fingers to push your chin backward (retraction). Do not bend your neck for this movement.  Continue to face straight ahead. If you are doing the exercise properly, you will feel a slight sensation in your throat and a stretch at the back of your neck. 4. Hold the stretch for 1-2 seconds. Repeat __________ times. Complete this exercise __________ times a day. Strengthening exercises Neck press 1. Lie on your back on a firm bed or on the floor with a pillow under your head. 2. Use your neck muscles to push your head down on the pillow and straighten your spine. 3. Hold the position as well as you can. Keep your head facing up (in a neutral position) and your chin tucked. 4. Slowly count to 5 while holding this position. Repeat __________ times. Complete this exercise __________ times a day. Isometrics These are exercises in which you strengthen the muscles in your neck while keeping your neck still (isometrics). 1. Sit in a supportive chair and place your hand on your forehead. 2. Keep your head and face facing straight ahead. Do not flex or extend your neck while doing isometrics. 3. Push forward with your head and neck while pushing back with your hand. Hold for 10 seconds. 4. Do the sequence again, this time putting your hand against the back of your head. Use your head and neck to push backward against the hand pressure. 5. Finally, do the same exercise on either side of your head, pushing sideways against the pressure of your hand. Repeat  __________ times. Complete this exercise __________ times a day. Prone head lifts 1. Lie face-down (prone position), resting on your elbows so that your chest and upper back are raised. 2. Start with your head facing downward, near your chest. Position your chin either on or near your chest. 3. Slowly lift your head upward. Lift until you are looking straight ahead. Then continue lifting your head as far back as you can comfortably stretch. 4. Hold your head up for 5 seconds. Then slowly lower it to your starting position. Repeat __________  times. Complete this exercise __________ times a day. Supine head lifts 1. Lie on your back (supine position), bending your knees to point to the ceiling and keeping your feet flat on the floor. 2. Lift your head slowly off the floor, raising your chin toward your chest. 3. Hold for 5 seconds. Repeat __________ times. Complete this exercise __________ times a day. Scapular retraction 1. Stand with your arms at your sides. Look straight ahead. 2. Slowly pull both shoulders (scapulae) backward and downward (retraction) until you feel a stretch between your shoulder blades in your upper back. 3. Hold for 10-30 seconds. 4. Relax and repeat. Repeat __________ times. Complete this exercise __________ times a day. Contact a health care provider if:  Your neck pain or discomfort gets much worse when you do an exercise.  Your neck pain or discomfort does not improve within 2 hours after you exercise. If you have any of these problems, stop exercising right away. Do not do the exercises again unless your health care provider says that you can. Get help right away if:  You develop sudden, severe neck pain. If this happens, stop exercising right away. Do not do the exercises again unless your health care provider says that you can. This information is not intended to replace advice given to you by your health care provider. Make sure you discuss any questions you have with your health care provider. Document Revised: 09/12/2018 Document Reviewed: 09/12/2018 Elsevier Patient Education  2020 [employer].

## 2019-12-10 NOTE — Progress Notes (Signed)
[facility] [address] [place]. [place] [address] [phone]       OFFICE FOLLOW UP VISIT NOTE  Cheryl Mcdonald Date of Birth:  27-Dec-1940 Medical Record Number:  [id number]   Referring MD: Windle Guard  Reason for Referral: Headache HPI: Initial Consult 07/11/2019:Cheryl Mcdonald is a 79 year old pleasant Caucasian lady who states she has had new onset daily persistent headache for the last 4 weeks.  She woke up and noticed severe 10/10 headache which was bifrontal and throbbing and incapacitating.  She felt nauseous and threw up.  Since then she has had daily headaches which are mostly in the morning when she wakes up.  She has been taking ibuprofen as well as tramadol every day.  She is vague when asked about the exact number of tablets she has been taking.  She states that the medications put her to sleep and she feels fine but when she wakes up the headache is still there.  She denies any light sensitivity, sound sensitivity.  There are no specific triggers or relieving factors for the headaches except sleep.  She has not had any brain imaging study that I can see but states that she did [facility] and was told by Dr. Jeannetta Nap that it was fine.  I do not have either the disc of the report to review today.  She denies any focal accompanying symptoms in the form of slurred speech, loss of vision, blurred vision, vertigo, gait balance difficulties or weakness.  She does have remote history of migraine headaches but feels these headaches are different and they have persisted daily.  She has no other past neurological history of stroke, TIA, seizures, significant head injury or loss of consciousness.  She has been taking Depakote DR 125 mg tablets 1 or 2 tablets at night for sleep and not for  headache prevention :   Update 12/10/19 : She returns for follow-up after last visit 5 months ago.  She is accompanied by her granddaughter.  Patient  states she has had daily persistent headache since May of last year it has never gone away.  The headache is relieved to some degree when she lies down.  She has not been taking any over-the-counter medications.  She got only partial relief with Depakote DR 250 twice daily and last week her primary care physician increase the dose to 500 twice daily which is tolerating well.  She complains of constant disabling pain and is quite fed up.  She complains of mild tightness and restriction of neck movements as well.  She has not been doing any regular activities for stress laxation on neck exercises.  She has not tried Topamax yet. ROS 14 system review of systems is positive for headache, neck discomfort only only and all other systems negative PMH:  Past Medical History:  Diagnosis Date  . Headache     Social History:  Social History   Socioeconomic History  . Marital status: Widowed    Spouse name: Not on file  . Number of children: Not on file  . Years of education: Not on file  . Highest education level: Not on file  Occupational History  . Not on file  Tobacco Use  . Smoking status: Never Smoker  . Smokeless tobacco: Never Used  Substance and Sexual Activity  . Alcohol use: No  . Drug use: No  . Sexual activity: Not on file  Other Topics Concern  .  Not on file  Social History Narrative  . Not on file   Social Determinants of Health   Financial Resource Strain:   . Difficulty of Paying Living Expenses: Not on file  Food Insecurity:   . Worried About Charity fundraiser in the Last Year: Not on file  . Ran Out of Food in the Last Year: Not on file  Transportation Needs:   . Lack of Transportation (Medical): Not on file  . Lack of Transportation (Non-Medical): Not on file  Physical Activity:   . Days of Exercise per Week: Not on file  . Minutes of Exercise per Session: Not on file  Stress:   . Feeling of Stress : Not on file  Social Connections:   . Frequency of  Communication with Friends and Family: Not on file  . Frequency of Social Gatherings with Friends and Family: Not on file  . Attends Religious Services: Not on file  . Active Member of Clubs or Organizations: Not on file  . Attends Archivist Meetings: Not on file  . Marital Status: Not on file  Intimate Partner Violence:   . Fear of Current or Ex-Partner: Not on file  . Emotionally Abused: Not on file  . Physically Abused: Not on file  . Sexually Abused: Not on file    Medications:   Current Outpatient Medications on File Prior to Visit  Medication Sig Dispense Refill  . Ergocalciferol (VITAMIN D2 PO) TAKE 1 CAPSULE BY MOUTH ONCE A WEEK FOR 30 DAYS THEN 1 CAPSULE MONTHLY    . levothyroxine (SYNTHROID) 25 MCG tablet TAKE 1 TABLET BY MOUTH ONCE DAILY IN THE MORNING    . lovastatin (MEVACOR) 20 MG tablet Take 20 mg by mouth daily.    . ondansetron (ZOFRAN) 8 MG tablet Take by mouth every 8 (eight) hours as needed for nausea or vomiting.    . traZODone (DESYREL) 150 MG tablet TAKE 1 3 TO 1 TABLET BY MOUTH AT BEDTIME AS NEEDED FOR INSOMNIA    . venlafaxine XR (EFFEXOR-XR) 75 MG 24 hr capsule TAKE 1 CAPSULE BY MOUTH EVERY 24 HOURS FOR DEPRESSION HEADACHE MAY INCREASE BY 1 CAPSULE EVERY 4 DAYS MAX 3 TABLETS EVERY 24 HOURS WITH FOOD     No current facility-administered medications on file prior to visit.    Allergies:  No Known Allergies  Physical Exam General: well developed, well nourished elderly Caucasian lady, seated, in no evident distress Head: head normocephalic and atraumatic.   Neck: supple with no carotid or supraclavicular bruits Cardiovascular: regular rate and rhythm, no murmurs Musculoskeletal: no deformity Skin:  no rash/petichiae Vascular:  Normal pulses all extremities  Neurologic Exam Mental Status: Awake and fully alert. Oriented to place and time. Recent and remote memory intact. Attention span, concentration and fund of knowledge appropriate. Mood and  affect appropriate.  Cranial Nerves: Fundoscopic exam not done. Pupils equal, briskly reactive to light. Extraocular movements full without nystagmus. Visual fields full to confrontation. Hearing intact. Facial sensation intact. Face, tongue, palate moves normally and symmetrically.  Motor: Normal bulk and tone. Normal strength in all tested extremity muscles. Sensory.: intact to touch , pinprick , position and vibratory sensation.  Coordination: Rapid alternating movements normal in all extremities. Finger-to-nose and heel-to-shin performed accurately bilaterally. Gait and Station: Arises from chair without difficulty. Stance is normal. Gait demonstrates normal stride length and balance . Able to heel, toe and tandem walk with moderate  difficulty.  Reflexes: 1+ and symmetric. Toes downgoing.  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  I also recommend she do regular neck stretching exercises.  I will also refer the patient to Dr. Lucia Gaskins headache specialist in a practice for her opinion as well.  She will return for follow-up in the future as needed. I also recommend  she increase participation in regular activities for stress relaxation like regular walking, exercise, meditation and yoga.  Greater than 50% time during this 25-minute   visit was spent on counseling and coordination of care about her medication overuse headaches and answering questions  . Delia Heady, MD  [facility] [address] [place], [place] [address]  Phone [phone] Fax [phone] Note: This document was  prepared with digital dictation and possible smart phrase technology. Any transcriptional errors that result from this process are unintentional.

## 2019-12-17 ENCOUNTER — Telehealth: Payer: Self-pay | Admitting: Neurology

## 2019-12-17 NOTE — Telephone Encounter (Signed)
Cheryl Mcdonald, Cheryl Mcdonald(granddaughter on DPR) has called to report that the topiramate (TOPAMAX) 50 MG tablet has not helped pt's headaches and at this point pt is moody and having more confusion.  Please call

## 2019-12-17 NOTE — Telephone Encounter (Signed)
I returned the call to the patient's granddaughter, Neysa Bonito. The patient started topiramate 25mg  BID on 12/11/2019. States she understands the headache should not be better this quick. She tells me the patient, at baseline, has moodiness and confused. However, they feel she is worse since starting topiramate. She has a pending appt w/ Dr. 12/13/2019 on 12/30/2019. She wants to know if they should give the medication longer or would it be better to stop it.  Her granddaughter, 02/27/2020 would like to be called at [phone].

## 2019-12-18 NOTE — Telephone Encounter (Signed)
Per vo by Dr. Epimenio Foot, it is okay for the patient to stop taking the topiramate 25mg  BID and that further initiation of treatment can be discussed when she is evaluated by Dr. on 12/30/2019.  I returned the call to the patient's granddaughter who was in agreement with this plan.

## 2019-12-18 NOTE — Telephone Encounter (Signed)
Patient granddaughter called stating that if she doesn't get a call back today she will be discontinuing the medication due to the patient being confused and the concern for everything that's going on.

## 2019-12-18 NOTE — Addendum Note (Signed)
Addended by: Lindell Spar C on: 12/18/2019 12:36 PM   Modules accepted: Orders

## 2019-12-30 ENCOUNTER — Ambulatory Visit (INDEPENDENT_AMBULATORY_CARE_PROVIDER_SITE_OTHER): Payer: Medicare Other | Admitting: Neurology

## 2019-12-30 ENCOUNTER — Telehealth: Payer: Self-pay | Admitting: Neurology

## 2019-12-30 ENCOUNTER — Other Ambulatory Visit: Payer: Self-pay

## 2019-12-30 ENCOUNTER — Encounter: Payer: Self-pay | Admitting: Neurology

## 2019-12-30 VITALS — BP 112/69 | HR 91 | Temp 97.2°F | Ht 62.0 in | Wt 151.0 lb

## 2019-12-30 DIAGNOSIS — R51 Headache with orthostatic component, not elsewhere classified: Secondary | ICD-10-CM | POA: Diagnosis not present

## 2019-12-30 DIAGNOSIS — G4452 New daily persistent headache (NDPH): Secondary | ICD-10-CM

## 2019-12-30 DIAGNOSIS — G9600 Cerebrospinal fluid leak, unspecified: Secondary | ICD-10-CM

## 2019-12-30 NOTE — Telephone Encounter (Signed)
Cheryl Mcdonald, can you get me the CD for this patient's MRI of the brain from [facility] it was done at the end of 2020.  Thank you

## 2019-12-30 NOTE — Progress Notes (Signed)
[facility]    Provider:  Dr Jaynee Eagles Requesting Provider: Zara Chess, NP Primary Care Provider:  Leonard Downing, MD  CC:  headaches  HPI:  Cheryl Mcdonald is a 79 y.o. female here as requested by Zara Chess, NP for headaches.. the only think that makes her headaches better is to lie flat. Here with granddaughter. They have been referred by my colleague Dr. Leonie Man whose notes read: 07/11/2019 was seen for new daily persistent headache 4 weeks, woke up with 10/10 headache bifrontal and throbbing, nauseated all day. No light or sound sensitivity, sleep and laying down helps, she reported had an MRI which was "fine" since this headache started, no other focal or associated deficits.Remote hx of migraines.  Depakote did not help, it made her more confused. She wakes up and she feels pretty good, she gets up and the headache is there by breakfast, relieved by laying down but doesn't go away completely. Gets worse throughout the day and some days is very bad, always there all day long, frontal area, no inciting events or falls or head trauma, they have done "everything" they have helped with her medication, evaluated for depression, started multiple meds. No other focal neurologic deficits, associated symptoms, inciting events or modifiable factors.  Reviewed notes, labs and imaging from outside physicians, which showed:   CT head 12/2014: Personally reviewed images and agree with the following: 1. Left posterior scalp soft tissue injury without underlying fracture. 2. Negative for age noncontrast CT appearance of the brain.  Last labs I have October 2019 BMP elevated glucose otherwise unremarkable, CBC normal  Review of Systems: Patient complains of symptoms per HPI as well as the following symptoms: headache, dizziness. Pertinent negatives and positives per HPI. All others negative.   Social History   Socioeconomic History   Marital status: Widowed    Spouse  name: Not on file   Number of children: Not on file   Years of education: Not on file   Highest education level: High school graduate  Occupational History   Not on file  Tobacco Use   Smoking status: Never Smoker   Smokeless tobacco: Never Used  Substance and Sexual Activity   Alcohol use: No   Drug use: No   Sexual activity: Not on file  Other Topics Concern   Not on file  Social History Narrative   Lives alone   Right handed   Caffeine: 1-1.5 cups of coffee in the morning   Social Determinants of Health   Financial Resource Strain:    Difficulty of Paying Living Expenses: Not on file  Food Insecurity:    Worried About [place] in the Last Year: Not on file   Ran Out of Food in the Last Year: Not on file  Transportation Needs:    Lack of Transportation (Medical): Not on file   Lack of Transportation (Non-Medical): Not on file  Physical Activity:    Days of Exercise per Week: Not on file   Minutes of Exercise per Session: Not on file  Stress:    Feeling of Stress : Not on file  Social Connections:    Frequency of Communication with Friends and Family: Not on file   Frequency of Social Gatherings with Friends and Family: Not on file   Attends Religious Services: Not on file   Active Member of Clubs or Organizations: Not on file   Attends Archivist Meetings: Not on file   Marital Status: Not on  file  Intimate Partner Violence:    Fear of Current or Ex-Partner: Not on file   Emotionally Abused: Not on file   Physically Abused: Not on file   Sexually Abused: Not on file    Family History  Problem Relation Age of Onset   Migraines Neg Hx    Headache Neg Hx     Past Medical History:  Diagnosis Date   Confusion    at baseline, fluctuates, depends on well she sleeps, etc   Depression    on effexor, depakote   Headache    High cholesterol    on lovastatin   Hypothyroid    on synthroid     Patient  Active Problem List   Diagnosis Date Noted   New daily persistent headache 12/10/2019    Past Surgical History:  Procedure Laterality Date   CATARACT EXTRACTION      Current Outpatient Medications  Medication Sig Dispense Refill   divalproex (DEPAKOTE) 500 MG DR tablet Take 1 tablet (500 mg total) by mouth 2 (two) times daily. 30 tablet 2   Ergocalciferol (VITAMIN D2 PO) Takes twice a month     levothyroxine (SYNTHROID) 25 MCG tablet TAKE 1 TABLET BY MOUTH ONCE DAILY IN THE MORNING     lovastatin (MEVACOR) 20 MG tablet Take 20 mg by mouth daily.     traZODone (DESYREL) 150 MG tablet TAKE 1 3 TO 1 TABLET BY MOUTH AT BEDTIME AS NEEDED FOR INSOMNIA     venlafaxine XR (EFFEXOR-XR) 75 MG 24 hr capsule TAKE 1 CAPSULE BY MOUTH EVERY 24 HOURS FOR DEPRESSION HEADACHE MAY INCREASE BY 1 CAPSULE EVERY 4 DAYS MAX 3 TABLETS EVERY 24 HOURS WITH FOOD     ondansetron (ZOFRAN) 8 MG tablet Take by mouth every 8 (eight) hours as needed for nausea or vomiting.     No current facility-administered medications for this visit.    Allergies as of 12/30/2019 - Review Complete 12/30/2019  Allergen Reaction Noted   Topiramate Other (See Comments) 12/18/2019    Vitals: BP 112/69 (BP Location: Left Arm, Patient Position: Sitting)    Pulse 91    Temp (!) 97.2 F (36.2 C) Comment: granddaughter 97.7; both taken at front   Ht 5\' 2"  (1.575 m)    Wt 151 lb (68.5 kg)    BMI 27.62 kg/m  Last Weight:  Wt Readings from Last 1 Encounters:  12/30/19 151 lb (68.5 kg)   Last Height:   Ht Readings from Last 1 Encounters:  12/30/19 5\' 2"  (1.575 m)     Physical exam: Exam: Gen: NAD, conversant, well nourised, obese, well groomed                     CV: RRR, no MRG. No Carotid Bruits. No peripheral edema, warm, nontender Eyes: Conjunctivae clear without exudates or hemorrhage  Neuro: Detailed Neurologic Exam  Speech:    Speech is normal; fluent and spontaneous with normal comprehension.  Cognition:     The patient is oriented to person, place, and time;     recent memory impaired; remote intact.     language fluent;     normal attention, concentration Cranial Nerves:     The pupils are equal, round, and reactive to light.  Attempted funduscopy could not visualize due to small pupils. Visual fields are full to finger confrontation. Extraocular movements are intact. Trigeminal sensation is intact and the muscles of mastication are normal. The face is symmetric. The palate elevates in  the midline. Hearing intact. Voice is normal. Shoulder shrug is normal. The tongue has normal motion without fasciculations.   Coordination:    No dysmetria  Gait:    No ataxia or parkinsonism  Motor Observation:    No asymmetry, no atrophy, and no involuntary movements noted. Tone:    Normal muscle tone.    Posture:    Posture is normal. normal erect    Strength:    Strength is V/V in the upper and lower limbs.      Sensation: intact to LT     Reflex Exam:  DTR's:    Deep tendon reflexes in the upper and lower extremities are symmetrical bilaterally.   Toes:    The toes are downgoing bilaterally.   Clonus:    Clonus is absent.    Assessment/Plan: This is a 79 year old female who presents with new daily persistent headache.  She is here for a family member who is helping to care for her now, and she provides much information.  Patient 1 day developed an unusual headache, started in May 2020, patient reports clear positional quality, when she wakes up in the morning before getting out of bed she is fine, by the time she gets up and is making breakfast she has a headache.  Her headache worsens the longer she stands.  If she lays down the headache improves.  Patient says this is consistent every single day.  It is more in the frontal area.  They have been to multiple doctors, they have managed her medications, evaluated her sleeping, had MRI, saw my colleague who tried patient on Depakote and several  other headache medications which did not help, she stopped any over-the-counter medication use to make sure that this was not rebound.  Nothing is helping and she is frustrated.  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   MRI completed at [facility] without contrast.  By report, no parenchymal signal abnormalities, mild generalized cerebral atrophy, no acute infarct, no mass or hemorrhage, sella normal, no mention of any brain sagging or meningeal signal changes to indicate low pressure however this can be difficult to see on imaging and does not rule out this condition.  Need a copy of the CD - requested We discussed CT myelogram, unfortunately we cannot get a routine blood patch within our system without evidence for CSF leak, I did also discuss the low pressure headache team Dr. Johnnye Sima over at [facility] and offered referral there in addition.  Provided information.They wil discuss.  Discussed: To prevent or relieve headaches, try the following: Cool Compress. Lie down and place a cool compress on your head.  Avoid headache triggers. If certain foods or odors seem to have triggered your migraines in the past, avoid them. A headache diary might help you identify triggers.  Include physical activity in your daily routine. Try a daily walk or other moderate aerobic exercise.  Manage stress. Find healthy ways to cope with the stressors, such as delegating tasks on your to-do list.  Practice relaxation techniques. Try deep breathing, yoga, massage and visualization.  Eat regularly. Eating regularly scheduled meals and maintaining a healthy diet might help prevent headaches. Also, drink plenty of fluids.  Follow a regular sleep schedule. Sleep deprivation might contribute to headaches Consider biofeedback. With this mind-body technique, you learn to control certain bodily functions -- such as muscle tension, heart rate and blood pressure -- to prevent  headaches or reduce headache pain.    Proceed to emergency room if you experience new or worsening symptoms or symptoms do not resolve, if you have new neurologic symptoms or if headache is severe, or for any concerning symptom.   Provided education and documentation from American headache Society toolbox including articles on: chronic migraine medication overuse headache, chronic migraines, prevention of migraines, behavioral and other nonpharmacologic treatments for headache.    Cc: Venia Minks, NP,  Kaleen Mask, MD, Dr. Jethro Bolus, MD  [facility] [address] [place], [place] [address]  Phone [phone] Fax [phone]  A total of 60 minutes was spent on this patient's care, reviewing imaging, past records, recent hospitalization notes and results. Over half this time was spent on counseling patient on the  1. New daily persistent headache   2. Positional headache   3. Headache due to low cerebrospinal fluid pressure    diagnosis and different diagnostic and therapeutic options, counseling and coordination of care, risks and benefitsof management, compliance, or risk factor reduction and education.

## 2019-12-30 NOTE — Patient Instructions (Addendum)
Recommend CT Myelogram to look for CSF leak Look up [place] on you tube  email me and we can refer to [facility] or continue as above or both  Cerebrospinal Fluid Leak, Adult  Cerebrospinal fluid (CSF) is the clear fluid that surrounds, nourishes, and cushions the brain and spinal cord. The brain and spinal cord (central nervous system) are covered by a membrane (dura). CSF can leak through any tear or opening in the dura. A CSF leak can cause a headache and other symptoms. There may be a danger of infection spreading into the brain or spinal cord (meningitis) through the torn dura. In rare cases, the low CSF pressure can result in bleeding around the brain (subdural hematoma) or the development of blood clots in veins (venous sinus thrombosis). What are the causes? A CSF leak can result from any opening in the dura. Possible causes include:  Surgery in the area of the spinal cord or head.  An injury (trauma) to the head, neck, or back.  A procedure that involves injecting medicine into or near the spine.  A procedure in which a small amount of CSF is removed from the spine and examined (lumbar puncture or spinal tap).  A tumor that disrupts the dura. Sometimes, the cause is not known (spontaneous CSF leak). What are the signs or symptoms? Headache is the most common symptom of a CSF leak. This type of headache gets worse when you stand or sit up. It may be better when you lie still. Other possible symptoms include:  A stiff neck.  Nausea and vomiting.  Changes in hearing or vision.  Dizziness.  Ringing in the ears (tinnitus).  Clear or blood-tinged fluid dripping from the nose or ear (CSF rhinorrhea or CSF otorrhea).  Weakness or numbness on one side of the body. If meningitis develops, symptoms may also include:  Fever.  Chills.  Confusion or sleepiness.  Seizure. How is this diagnosed? This condition may be diagnosed based on:  Your symptoms and medical  history.  A physical exam.  Tests to confirm the diagnosis and locate the cause of the leak. These tests may include: ? Testing a sample of the fluid that is dripping from your nose or ear to find out if it is CSF. ? Imaging tests of the central nervous system, such as a CT scan or MRI. ? A type of CT scan that is done after dye is injected into the spinal canal (CT myelogram). ? A lumbar puncture to measure pressure inside the central nervous system. How is this treated? Treatment for a CSF leak depends on the size and cause of the leak. Treatment also depends on whether there is an infection. Many leaks will close over time with initial treatment and home care. You may need to:  Have complete bed rest.  Get more fluids by mouth or through an IV inserted into a vein.  Drink caffeinated beverages.  Take headache medicine. If the leak does not get better with time, or if there is an infection, treatment may include:  IV antibiotics for bacterial meningitis.  A blood clot patch to seal the leak.  Surgery to close a hole in the dura. The exact procedure depends on the location of the leak. Follow these instructions at home:  Learn as much as you can about your condition and work closely with your health care provider.  Take over-the-counter and prescription medicines only as told by your health care provider.  Rest as told by your  health care provider.  Return to your normal activities as told by your health care provider. Ask your health care provider what activities are safe for you.  Drink enough fluid to keep your urine pale yellow.  Keep all follow-up visits as told by your health care provider. This is important. Contact a health care provider if you:  Continue to have symptoms.  Develop any new symptoms.  Have fluid dripping from your ear or nose. Get help right away if you:  Have a fever or chills.  Have a headache.  Have a stiff neck.  Are confused or  sleepy. Summary  A cerebrospinal fluid leak is a leak of the fluid that surrounds the brain and spinal cord.  A CSF leak can happen for many reasons, such as surgery in the area of the spinal cord or head, an injury to the head, neck, or back, or a lumbar puncture. Sometimes the cause is not known.  Treatment depends on the size and cause of the leak. Treatment may include rest and drinking fluids, or it could involve surgery.  Contact a health care provider if you continue to have symptoms or if you develop any new symptoms. This information is not intended to replace advice given to you by your health care provider. Make sure you discuss any questions you have with your health care provider. Document Revised: 08/16/2018 Document Reviewed: 08/16/2018 Elsevier Patient Education  [place].

## 2019-12-31 ENCOUNTER — Telehealth: Payer: Self-pay

## 2019-12-31 NOTE — Telephone Encounter (Signed)
Request made to [facility] on 12/31/19

## 2019-12-31 NOTE — Telephone Encounter (Signed)
Patient PCP called wanting to speak with MD Dr.Yan in regards to patients visit yesterday. PCP states patient has called them for medical advice but he is needing more information in regards to yesterdays visit.  Please follow up.

## 2019-12-31 NOTE — Telephone Encounter (Signed)
Dr Jeannetta Nap has called back stating he no longer needs a call from Dr Lucia Gaskins, he has heard back from pt's family.

## 2020-01-01 NOTE — Addendum Note (Signed)
Addended by: Naomie Dean B on: 01/01/2020 05:08 PM   Modules accepted: Orders

## 2020-01-02 ENCOUNTER — Telehealth: Payer: Self-pay | Admitting: Neurology

## 2020-01-02 NOTE — Telephone Encounter (Signed)
Medicare/bcbs supp order sent to [facility]. No auth they will reach out to the patient to schedule.  

## 2020-01-06 ENCOUNTER — Telehealth: Payer: Self-pay | Admitting: Neurology

## 2020-01-06 ENCOUNTER — Telehealth: Payer: Self-pay

## 2020-01-06 NOTE — Telephone Encounter (Signed)
Spoke with patient's grand-daughter, Cheryl Mcdonald, to review patient's medications and med allergies.  She was informed that [name] will be here two hours, needs a driver (I told her she may come in to hear procedure and discharge instructions), and will need to be on strict bedrest for 24 hours after the myelogram.  Cheryl Mcdonald also was informed [name] will need to hold Effexor and Trazodone for 48 hours before, and 24 hours after, the myelogram.

## 2020-01-06 NOTE — Telephone Encounter (Signed)
Order faxed to [facility] @ [facility]. Received a receipt of confirmation.

## 2020-01-06 NOTE — Telephone Encounter (Signed)
Hi will you print and fax LP order to [facility] fax [phone]. Thanks so much [place].

## 2020-01-09 ENCOUNTER — Ambulatory Visit
Admission: RE | Admit: 2020-01-09 | Discharge: 2020-01-09 | Disposition: A | Discharge status: Home / Self Care | Payer: Medicare Other | Source: Ambulatory Visit | Attending: Neurology | Admitting: Neurology

## 2020-01-09 ENCOUNTER — Other Ambulatory Visit: Payer: Self-pay

## 2020-01-09 DIAGNOSIS — G4452 New daily persistent headache (NDPH): Secondary | ICD-10-CM

## 2020-01-09 DIAGNOSIS — R51 Headache with orthostatic component, not elsewhere classified: Secondary | ICD-10-CM

## 2020-01-09 DIAGNOSIS — G9600 Cerebrospinal fluid leak, unspecified: Secondary | ICD-10-CM

## 2020-01-09 MED ORDER — DIAZEPAM 5 MG PO TABS
5.0000 mg | ORAL_TABLET | Freq: Once | ORAL | Status: AC
  Administered 2020-01-09: 11:00:00 5 mg via ORAL

## 2020-01-09 MED ORDER — IOPAMIDOL (ISOVUE-M 300) INJECTION 61%
10.0000 mL | Freq: Once | INTRAMUSCULAR | Status: AC | PRN
  Administered 2020-01-09: 10 mL via INTRATHECAL

## 2020-01-09 NOTE — Discharge Instructions (Signed)
Myelogram Discharge Instructions  1. Go home and rest quietly for the next 24 hours.  It is important to lie flat for the next 24 hours.  Get up only to go to the restroom.  You may lie in the bed or on a couch on your back, your stomach, your left side or your right side.  You may have one pillow under your head.  You may have pillows between your knees while you are on your side or under your knees while you are on your back.  2. DO NOT drive today.  Recline the seat as far back as it will go, while still wearing your seat belt, on the way home.  3. You may get up to go to the bathroom as needed.  You may sit up for 10 minutes to eat.  You may resume your normal diet and medications unless otherwise indicated.  Drink lots of extra fluids today and tomorrow.  4. The incidence of headache, nausea, or vomiting is about 5% (one in 20 patients).  If you develop a headache, lie flat and drink plenty of fluids until the headache goes away.  Caffeinated beverages may be helpful.  If you develop severe nausea and vomiting or a headache that does not go away with flat bed rest, call [phone].  5. You may resume normal activities after your 24 hours of bed rest is over; however, do not exert yourself strongly or do any heavy lifting tomorrow. If when you get up you have a headache when standing, go back to bed and force fluids for another 24 hours.  6. Call your physician for a follow-up appointment.  The results of your myelogram will be sent directly to your physician by the following day.  7. If you have any questions or if complications develop after you arrive home, please call [phone].  Discharge instructions have been explained to the patient.  The patient, or the person responsible for the patient, fully understands these instructions.  YOU MAY RESTART YOUR TRAZODONE AND VENLAFAXINE TOMORROW 01/10/2020 AT 10:30AM.

## 2020-01-09 NOTE — Progress Notes (Signed)
No evidence of csf leak. If she develops a post-LP headache we can order a blood patch. I recommend referral to [facility] as we discussed thanks

## 2020-01-14 ENCOUNTER — Telehealth: Payer: Self-pay | Admitting: *Deleted

## 2020-01-14 NOTE — Telephone Encounter (Signed)
I spoke with Neysa Bonito (on DPR) pt's granddaughter and discussed LP result from Dr. Lucia Gaskins. She read the result on mychart as well. She said interestingly enough the patient had a decrease in headache after her LP. She is not sure they will pursue referral to [facility] but she will talk to pt's son again and if they want a referral they will let us know. She also said if the decrease in headaches changes the recommendation from Dr. Lucia Gaskins to please let her know.

## 2020-01-14 NOTE — Telephone Encounter (Signed)
-----   Message from Anson Fret, MD sent at 01/09/2020  4:06 PM EST ----- No evidence of csf leak. If she develops a post-LP headache we can order a blood patch. I recommend referral to [facility] as we discussed thanks

## 2020-01-15 NOTE — Telephone Encounter (Signed)
I called Christy back and gave her Dr. Trevor Mace most recent message. She is ok with Dr. Lucia Gaskins sending the referral to [facility] d/t the wait time. Although she is not sure they will proceed, she understands she they can cancel if patient still doing better.

## 2020-01-15 NOTE — Telephone Encounter (Signed)
That is interesting, I can;t explain it. Can you please ask them If I can refer them to [facility]? It takes months to get an appoitment and if she is still feeling well then she can cancel but I don;t want to wait until she needs it. tjanks

## 2020-01-16 ENCOUNTER — Other Ambulatory Visit: Payer: Self-pay | Admitting: Neurology

## 2020-01-16 DIAGNOSIS — R51 Headache with orthostatic component, not elsewhere classified: Secondary | ICD-10-CM

## 2020-02-11 ENCOUNTER — Other Ambulatory Visit: Payer: Self-pay | Admitting: Neurology

## 2020-04-06 ENCOUNTER — Other Ambulatory Visit: Payer: Self-pay

## 2020-04-06 MED ORDER — DIVALPROEX SODIUM 500 MG PO DR TAB: 500.0000 mg | DELAYED_RELEASE_TABLET | Freq: Two times a day (BID) | ORAL | 3 refills | Status: AC

## 2020-04-08 ENCOUNTER — Telehealth: Payer: Self-pay | Admitting: Neurology

## 2020-04-08 NOTE — Telephone Encounter (Signed)
Patient granddaughter called to inform that she was informed from a rep at [facility]  enhanced brain imaging would need to be done in order for pt to be scheduled. States they informed they would be unable to begin process unless imaging is sent. Cb#[phone]

## 2020-04-09 NOTE — Telephone Encounter (Signed)
I spoke with Cheryl Mcdonald. She said yesterday she had the imaging facility [facility] the imaging over to [facility] and the granddaughter Neysa Bonito is supposed to call back if this does not get done.

## 2020-04-20 ENCOUNTER — Other Ambulatory Visit: Payer: Self-pay | Admitting: Family Medicine

## 2020-04-20 DIAGNOSIS — R519 Headache, unspecified: Secondary | ICD-10-CM

## 2020-05-03 ENCOUNTER — Ambulatory Visit
Admission: RE | Admit: 2020-05-03 | Discharge: 2020-05-03 | Disposition: A | Discharge status: Home / Self Care | Payer: Medicare Other | Source: Ambulatory Visit | Attending: Family Medicine | Admitting: Family Medicine

## 2020-05-03 DIAGNOSIS — R519 Headache, unspecified: Secondary | ICD-10-CM

## 2020-05-03 MED ORDER — GADOBENATE DIMEGLUMINE 529 MG/ML IV SOLN
14.0000 mL | Freq: Once | INTRAVENOUS | Status: AC | PRN
  Administered 2020-05-03: 11 mL via INTRAVENOUS

## 2020-05-18 ENCOUNTER — Other Ambulatory Visit: Payer: Medicare Other

## 2020-06-27 ENCOUNTER — Encounter (HOSPITAL_COMMUNITY): Payer: Self-pay

## 2020-06-27 ENCOUNTER — Emergency Department (HOSPITAL_COMMUNITY)
Admission: EM | Admit: 2020-06-27 | Discharge: 2020-06-28 | Disposition: A | Discharge status: Home / Self Care | Payer: Medicare Other | Attending: Emergency Medicine | Admitting: Emergency Medicine

## 2020-06-27 ENCOUNTER — Other Ambulatory Visit: Payer: Self-pay

## 2020-06-27 DIAGNOSIS — R110 Nausea: Secondary | ICD-10-CM | POA: Insufficient documentation

## 2020-06-27 DIAGNOSIS — R519 Headache, unspecified: Secondary | ICD-10-CM | POA: Diagnosis not present

## 2020-06-27 DIAGNOSIS — Z5321 Procedure and treatment not carried out due to patient leaving prior to being seen by health care provider: Secondary | ICD-10-CM | POA: Diagnosis not present

## 2020-06-27 LAB — CBC
HCT: 50.8 % — ABNORMAL HIGH (ref 36.0–46.0)
Hemoglobin: 16.4 g/dL — ABNORMAL HIGH (ref 12.0–15.0)
MCH: 30.9 pg (ref 26.0–34.0)
MCHC: 32.3 g/dL (ref 30.0–36.0)
MCV: 95.7 fL (ref 80.0–100.0)
Platelets: 208 10*3/uL (ref 150–400)
RBC: 5.31 MIL/uL — ABNORMAL HIGH (ref 3.87–5.11)
RDW: 12.6 % (ref 11.5–15.5)
WBC: 8.5 10*3/uL (ref 4.0–10.5)
nRBC: 0 % (ref 0.0–0.2)

## 2020-06-27 LAB — BASIC METABOLIC PANEL
Anion gap: 11 (ref 5–15)
BUN: 15 mg/dL (ref 8–23)
CO2: 25 mmol/L (ref 22–32)
Calcium: 9.3 mg/dL (ref 8.9–10.3)
Chloride: 103 mmol/L (ref 98–111)
Creatinine, Ser: 0.94 mg/dL (ref 0.44–1.00)
GFR calc Af Amer: >60 mL/min (ref >60)
GFR calc non Af Amer: 58 mL/min — ABNORMAL LOW (ref >60)
Glucose, Bld: 139 mg/dL — ABNORMAL HIGH (ref 70–99)
Potassium: 4.6 mmol/L (ref 3.5–5.1)
Sodium: 139 mmol/L (ref 135–145)

## 2020-06-27 NOTE — ED Triage Notes (Addendum)
Pt arrives to ED w/ c/o 10/10 headache that has been persistent over the last year but has gotten worse over the last few days. Pt states she has not gotten any relief from prescribed pain medications. Pt denies photosensitivity, neuro changes. Pt endorses nausea. Pt has had recent ct and mri which was negative. Pt given referral to f/u w/ neurology but could not get in until Nov 8.

## 2020-06-27 NOTE — ED Notes (Signed)
Pt called multiple times no answer 

## 2020-07-17 ENCOUNTER — Telehealth: Payer: Self-pay | Admitting: Nurse Practitioner

## 2020-07-17 NOTE — Telephone Encounter (Signed)
Received a call back from the pt's granddaughter to schedule a new hem appt. Ms. Gomes has been scheduled to see Lacie on 8/25 at 145pm. Aware to arrive 15 minutes early.

## 2020-07-22 ENCOUNTER — Telehealth: Payer: Self-pay

## 2020-07-22 ENCOUNTER — Encounter: Payer: Self-pay | Admitting: Nurse Practitioner

## 2020-07-22 ENCOUNTER — Inpatient Hospital Stay: Payer: Medicare Other

## 2020-07-22 ENCOUNTER — Inpatient Hospital Stay: Payer: Medicare Other | Attending: Nurse Practitioner | Admitting: Nurse Practitioner

## 2020-07-22 ENCOUNTER — Other Ambulatory Visit: Payer: Self-pay

## 2020-07-22 VITALS — BP 126/76 | HR 107 | Temp 97.7°F | Resp 20 | Ht 62.0 in | Wt 146.8 lb

## 2020-07-22 DIAGNOSIS — R519 Headache, unspecified: Secondary | ICD-10-CM | POA: Insufficient documentation

## 2020-07-22 DIAGNOSIS — E039 Hypothyroidism, unspecified: Secondary | ICD-10-CM | POA: Diagnosis not present

## 2020-07-22 DIAGNOSIS — J449 Chronic obstructive pulmonary disease, unspecified: Secondary | ICD-10-CM | POA: Insufficient documentation

## 2020-07-22 DIAGNOSIS — D751 Secondary polycythemia: Secondary | ICD-10-CM

## 2020-07-22 DIAGNOSIS — R419 Unspecified symptoms and signs involving cognitive functions and awareness: Secondary | ICD-10-CM

## 2020-07-22 DIAGNOSIS — Z79899 Other long term (current) drug therapy: Secondary | ICD-10-CM | POA: Diagnosis not present

## 2020-07-22 LAB — CBC WITH DIFFERENTIAL (CANCER CENTER ONLY)
Abs Immature Granulocytes: 0.05 10*3/uL (ref 0.00–0.07)
Basophils Absolute: 0 10*3/uL (ref 0.0–0.1)
Basophils Relative: 1 %
Eosinophils Absolute: 0.1 10*3/uL (ref 0.0–0.5)
Eosinophils Relative: 1 %
HCT: 42.1 % (ref 36.0–46.0)
Hemoglobin: 13.9 g/dL (ref 12.0–15.0)
Immature Granulocytes: 1 %
Lymphocytes Relative: 37 %
Lymphs Abs: 2.2 10*3/uL (ref 0.7–4.0)
MCH: 30.8 pg (ref 26.0–34.0)
MCHC: 33 g/dL (ref 30.0–36.0)
MCV: 93.1 fL (ref 80.0–100.0)
Monocytes Absolute: 0.6 10*3/uL (ref 0.1–1.0)
Monocytes Relative: 11 %
Neutro Abs: 2.9 10*3/uL (ref 1.7–7.7)
Neutrophils Relative %: 49 %
Platelet Count: 140 10*3/uL — ABNORMAL LOW (ref 150–400)
RBC: 4.52 MIL/uL (ref 3.87–5.11)
RDW: 12.4 % (ref 11.5–15.5)
WBC Count: 5.9 10*3/uL (ref 4.0–10.5)
nRBC: 0 % (ref 0.0–0.2)

## 2020-07-22 LAB — URINALYSIS, COMPLETE (UACMP) WITH MICROSCOPIC
Bilirubin Urine: NEGATIVE
Glucose, UA: NEGATIVE mg/dL
Hgb urine dipstick: NEGATIVE
Ketones, ur: 5 mg/dL — AB
Nitrite: NEGATIVE
Protein, ur: NEGATIVE mg/dL
Specific Gravity, Urine: 1.029 (ref 1.005–1.030)
pH: 5 (ref 5.0–8.0)

## 2020-07-22 NOTE — Telephone Encounter (Signed)
Call placed to Dr. Jeannetta Nap office , [address] [place] to request lab results from 07/01/20 spoke with secretary. [phone]

## 2020-07-22 NOTE — Progress Notes (Addendum)
[facility]  Telephone:([phone]) [phone] Fax:(336) [phone]  Clinic New consult Note   Patient Care Team: Kaleen Mask, MD as PCP - General (Family Medicine) Date of Service: 07/22/2020   CHIEF COMPLAINTS/PURPOSE OF CONSULTATION:  Erythrocytosis, referred by PCP Dr. Jeannetta Nap  HISTORY OF PRESENTING ILLNESS:  Cheryl Mcdonald 79 y.o. female with past medical history significant for acute onset of daily persistent unrelenting headache in 03/2019 daily headaches is here because of elevated red blood cell count. She was found to have abnormal CBC from 10/26/2016 with Hgb 15.4 and HCT 47.0, no other abnormal blood counts.  A CBC on 09/06/2018 was completely normal, Hgb 13.8, HCT 43.2.  She was again found to have elevated red blood cells on 06/27/2020 with RBC 5.31, hemoglobin 16.4, HCT 50.8.  We will recently at [facility] on 07/13/2020 she had a normal RBC, hemoglobin, and hematocrit.Marland Kitchen EPO was elevated to 23, Jak 2 V617F and exon 12-15 are negative.  She was referred to hematology for further work-up and management.  Socially, she is widowed, lives alone, she had 3 children, 1 son died from a fatal MI, another in a car accident.  She has 1 living son.  She is here today with her granddaughter who helps manage her health care.  She is mostly independent with ADLs, has home health aide 3 times per week.  She was in her usual state of good health until abrupt headache started last year.  She is retired from [employer].  Her brother and brother's 2 sons had unknown type of cancer.  The patient cannot recall the last dates of her own cancer screenings.  Chart review indicates last mammogram in 2017.  No colonoscopy in epic.  Denies alcohol, tobacco, or drug use.  Denies hormone replacement, never had blood transfusion.  Does not know she snores, has not been tested for sleep apnea.  Today, she presents with a severe headache she rates 10/10.  She is less active due to headache pain.  Her appetite  is normal.  Denies unintentional weight loss.  No recent fever or chills.  No cough, chest pain, dyspnea.  Denies history of stroke.  Denies itching or redness.  She thinks she is hydrated.   MEDICAL HISTORY:  Past Medical History:  Diagnosis Date  . Confusion    at baseline, fluctuates, depends on well she sleeps, etc  . Depression    on effexor, depakote  . Headache   . High cholesterol    on lovastatin  . Hypothyroid    on synthroid     SURGICAL HISTORY: Past Surgical History:  Procedure Laterality Date  . CATARACT EXTRACTION      SOCIAL HISTORY: Social History   Socioeconomic History  . Marital status: Widowed    Spouse name: Not on file  . Number of children: Not on file  . Years of education: Not on file  . Highest education level: High school graduate  Occupational History  . Not on file  Tobacco Use  . Smoking status: Never Smoker  . Smokeless tobacco: Never Used  Vaping Use  . Vaping Use: Never used  Substance and Sexual Activity  . Alcohol use: No  . Drug use: No  . Sexual activity: Not on file  Other Topics Concern  . Not on file  Social History Narrative   Lives alone   Right handed   Caffeine: 1-1.5 cups of coffee in the morning   Social Determinants of Corporate investment banker  Strain:   . Difficulty of Paying Living Expenses: Not on file  Food Insecurity:   . Worried About Programme researcher, broadcasting/film/video in the Last Year: Not on file  . Ran Out of Food in the Last Year: Not on file  Transportation Needs:   . Lack of Transportation (Medical): Not on file  . Lack of Transportation (Non-Medical): Not on file  Physical Activity:   . Days of Exercise per Week: Not on file  . Minutes of Exercise per Session: Not on file  Stress:   . Feeling of Stress : Not on file  Social Connections:   . Frequency of Communication with Friends and Family: Not on file  . Frequency of Social Gatherings with Friends and Family: Not on file  . Attends Religious  Services: Not on file  . Active Member of Clubs or Organizations: Not on file  . Attends Banker Meetings: Not on file  . Marital Status: Not on file  Intimate Partner Violence:   . Fear of Current or Ex-Partner: Not on file  . Emotionally Abused: Not on file  . Physically Abused: Not on file  . Sexually Abused: Not on file    FAMILY HISTORY: Family History  Problem Relation Age of Onset  . Migraines Neg Hx   . Headache Neg Hx     ALLERGIES:  is allergic to topiramate.  MEDICATIONS:  Current Outpatient Medications  Medication Sig Dispense Refill  . divalproex (DEPAKOTE) 125 MG DR tablet Take 125-250 mg by mouth at bedtime.    . divalproex (DEPAKOTE) 500 MG DR tablet Take 1 tablet (500 mg total) by mouth 2 (two) times daily. 60 tablet 3  . donepezil (ARICEPT) 5 MG tablet Take 5 mg by mouth at bedtime.    Marland Kitchen HYDROcodone-acetaminophen (NORCO/VICODIN) 5-325 MG tablet Take 1 tablet by mouth every 6 (six) hours as needed. for pain    . levothyroxine (SYNTHROID) 25 MCG tablet TAKE 1 TABLET BY MOUTH ONCE DAILY IN THE MORNING    . levothyroxine (SYNTHROID) 50 MCG tablet Take 50 mcg by mouth every morning.    . ondansetron (ZOFRAN) 8 MG tablet Take by mouth every 8 (eight) hours as needed for nausea or vomiting.    . traZODone (DESYREL) 150 MG tablet TAKE 1 3 TO 1 TABLET BY MOUTH AT BEDTIME AS NEEDED FOR INSOMNIA    . venlafaxine XR (EFFEXOR-XR) 75 MG 24 hr capsule TAKE 1 CAPSULE BY MOUTH EVERY 24 HOURS FOR DEPRESSION HEADACHE MAY INCREASE BY 1 CAPSULE EVERY 4 DAYS MAX 3 TABLETS EVERY 24 HOURS WITH FOOD    . Ergocalciferol (VITAMIN D2 PO) Takes twice a month (Patient not taking: Reported on 07/22/2020)    . lovastatin (MEVACOR) 20 MG tablet Take 20 mg by mouth daily. (Patient not taking: Reported on 07/22/2020)     No current facility-administered medications for this visit.    REVIEW OF SYSTEMS:   Constitutional: Denies fevers, chills or abnormal night sweats (+) limited  activity Eyes: Denies blurriness of vision, double vision or watery eyes Ears, nose, mouth, throat, and face: Denies mucositis or sore throat Respiratory: Denies cough, dyspnea or wheezes Cardiovascular: Denies palpitation, chest discomfort or lower extremity swelling Gastrointestinal:  Denies nausea, heartburn or change in bowel habits Skin: Denies abnormal skin rashes Lymphatics: Denies new lymphadenopathy or easy bruising Neurological:Denies numbness, tingling or new weaknesses (+) severe daily persistent headache since 03/2019 Behavioral/Psych: Mood is stable, no new changes  All other systems were reviewed with the  patient and are negative.  PHYSICAL EXAMINATION:  Vitals:   07/22/20 1407  BP: 126/76  Pulse: (!) 107  Resp: 20  Temp: 97.7 F (36.5 C)  SpO2: 96%   Filed Weights   07/22/20 1407  Weight: 146 lb 12.8 oz (66.6 kg)    GENERAL:alert, no distress and comfortable SKIN: No rash to exposed skin, no erythema EYES: sclera clear NECK: Without mass LYMPH:  no palpable cervical or supraclavicular lymphadenopathy LUNGS: clear with normal breathing effort HEART: regular rate & rhythm, no lower extremity edema ABDOMEN:abdomen soft, non-tender and normal bowel sounds PSYCH: alert & oriented x 3 with fluent speech NEURO: no focal motor/sensory deficits  LABORATORY DATA:  I have reviewed the data as listed CBC Latest Ref Rng & Units 07/22/2020 06/27/2020 09/06/2018  WBC 4.0 - 10.5 K/uL 5.9 8.5 6.8  Hemoglobin 12.0 - 15.0 g/dL 16.113.9 16.4(H) 13.8  Hematocrit 36 - 46 % 42.1 50.8(H) 43.2  Platelets 150 - 400 K/uL 140(L) 208 172    CMP Latest Ref Rng & Units 06/27/2020 09/06/2018  Glucose 70 - 99 mg/dL 096(E139(H) 454(U143(H)  BUN 8 - 23 mg/dL 15 12  Creatinine 9.810.44 - 1.00 mg/dL 1.910.94 4.780.82  Sodium 295135 - 145 mmol/L 139 141  Potassium 3.5 - 5.1 mmol/L 4.6 3.7  Chloride 98 - 111 mmol/L 103 106  CO2 22 - 32 mmol/L 25 26  Calcium 8.9 - 10.3 mg/dL 9.3 6.2(Z8.4(L)  Total Protein 6.5 - 8.1 g/dL  - 6.0(L)  Total Bilirubin 0.3 - 1.2 mg/dL - 0.5  Alkaline Phos 38 - 126 U/L - 67  AST 15 - 41 U/L - 22  ALT 0 - 44 U/L - 14     RADIOGRAPHIC STUDIES: I have personally reviewed the radiological images as listed and agreed with the findings in the report. No results found.  ASSESSMENT & PLAN: 79 yo female with no significant PMH except chronic daily headache since 03/2019  1. Polycythemia  -We reviewed her medical record and outside lab in detail with the patient and her granddaughter. She has no PMH except acute onset severe daily persistent headache since 03/2020. Previous MRI and CT were negative -she was found to have elevated H/H in 05/2020 while in ED for severe headache. We reviewed potential causes for elevated H/H -PCP ordered extensive JAK2 panel which was negative, and EPO which was slightly elevated, which does not support PV  -she has no risk factors for secondary polycythemia such as smoking, COPD, or chronic lung disease that we are aware of. She has not had sleep study to r/o OSA.  -we discussed other causes of elevated H/H such as dehydration or occult malignancy, particularly a renal malignancy given her elevated EPO.  -today's H/H is normal. UA is negative for hematuria. We are not recommending further work up at this time.  -we encouraged her to get age-appropriate cancer screenings and engage in healthy lifestyle.  -she can f/u with PCP, if HCT >50% in the future we can see her back to consider therapeutic phlebotomy to see if her headaches improve, but currently this is not felt to be related.  -F/u open.    PLAN: -Medical record reviewed  -Normal H/H on today's CBC, no further work up recommended  -UA shows RBC in normal range -Epo pending -F/u with PCP, will see her back if Hct >50% in the future     All questions were answered. The patient knows to call the clinic with any problems, questions or concerns.  Pollyann Samples, NP 07/23/20   Addendum I have  seen the patient, examined her. I agree with the assessment and and plan and have edited the notes.   Cheryl Mcdonald is a 79 yo female wo significant past medical history, presents with worsening headaches for the past1-1.5 years.  She has been evaluated by neurologist, and brain MRI was negative.  Her labs incidentally found elevated H/H one month ago when she was evaluated at ED for severe headache.  Further workup one week ago showed normal CBC, negative JAK2 mutation including exon 12 mutations and elevated EPO level. She does not have obvious causes of secondary polycythemia, such as COPD, smoking history, sleep apnea, etc. we discussed the possibility of dehydration caused blood to concentration.  Will repeat CBC today, if she has HCT>50%, will proceed phlebotomy to see if her headaches improved, and may consider CT scan to rule out malignancy as a secondary cause of her polycythemia, especially kidney cancer.  If her CBC normal today, no further work-up will be recommended, and she will not need f/u with [place].  I encouraged her to have routine cancer screening, including mammogram and colonoscopy.  Malachy Mood  07/22/2020

## 2020-07-23 ENCOUNTER — Encounter: Payer: Self-pay | Admitting: Nurse Practitioner

## 2020-07-23 LAB — ERYTHROPOIETIN: Erythropoietin: 19.5 m[IU]/mL — ABNORMAL HIGH (ref 2.6–18.5)

## 2020-07-24 ENCOUNTER — Other Ambulatory Visit: Payer: Self-pay | Admitting: Medical

## 2020-07-24 ENCOUNTER — Telehealth: Payer: Self-pay

## 2020-07-24 DIAGNOSIS — N3000 Acute cystitis without hematuria: Secondary | ICD-10-CM

## 2020-07-24 MED ORDER — NITROFURANTOIN MONOHYD MACRO 100 MG PO CAPS: 100.0000 mg | ORAL_CAPSULE | Freq: Two times a day (BID) | ORAL | 0 refills | Status: AC

## 2020-07-24 NOTE — Telephone Encounter (Signed)
Cheryl Mcdonald' son Cheryl Mcdonald called.  He saw her urinalysis and was concerned that she has a UTI, he states she is a little confused.  Reviewed by Cheryl Mcdonald, Her rx microbid.  I called Cheryl Mcdonald back and left vm regarding the above

## 2020-07-27 ENCOUNTER — Telehealth: Payer: Self-pay

## 2020-07-27 NOTE — Telephone Encounter (Signed)
-----   Message from Pollyann Samples, NP sent at 07/23/2020  3:54 PM EDT ----- Please let her know repeat CBC from 8/25 shows normal hemoglobin/hematocrit, and UA shows no increased blood. She does not have polycythemia. We are not recommending further work up, phlebotomy, or scan at this time. She can f/u with PCP, we will see her back if H/H increases in the future.   Please fax the consult note to dr. Jeannetta Nap once Dr. Mosetta Putt closes the encounter.   Thanks, Clayborn Heron

## 2020-07-27 NOTE — Telephone Encounter (Signed)
Third attempt was able to reach granddaughter did review labs and recommendations to follow up with pcp

## 2020-09-27 IMAGING — XA DG MYELOGRAPHY LUMBAR INJ MULTI REGION
12 of 21 series · 12 of 21 positions shown · non-contrast
Comparison: none

CLINICAL DATA: Severe headache [REDACTED]. Rule out CSF leak. No
previous spinal surgery.
TECHNIQUE: Contiguous axial images were obtained through the Cervical and
Lumbar spine without infusion. Coronal and sagittal reconstructions
were obtained of the axial image sets through the Cervical spine.
Coronal, sagittal, and disc space reconstructions were obtained of
the axial image sets through the Lumbar spine.

[Series 1: vasc adipose · 1 of 1 slices shown (1 of 12)]
[im 1/1]
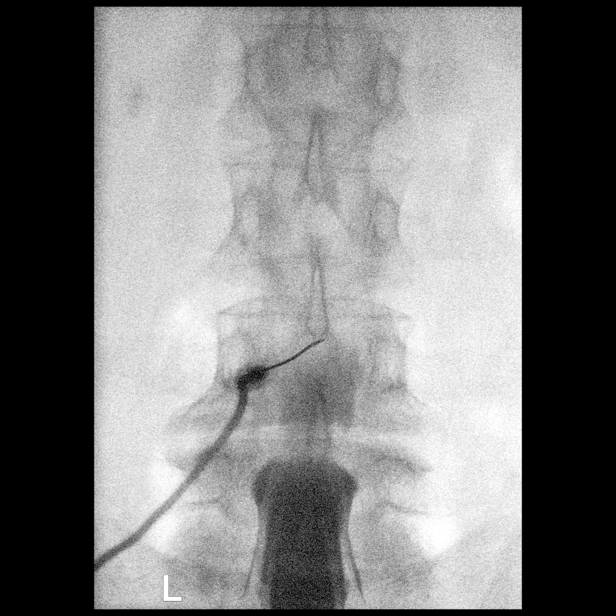

[Series 3: vasc adipose · 1 of 1 slices shown (2 of 12)]
[im 1/1]
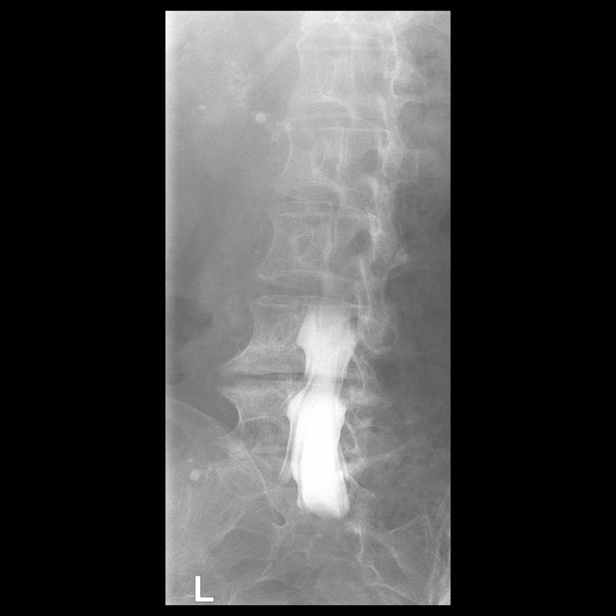

[Series 5: vasc adipose · 1 of 1 slices shown (3 of 12)]
[im 1/1]
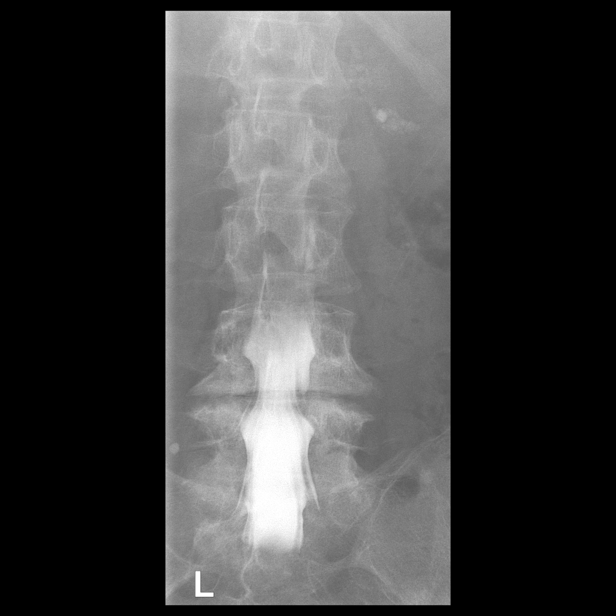

[Series 7: vasc adipose · 1 of 1 slices shown (4 of 12)]
[im 1/1]
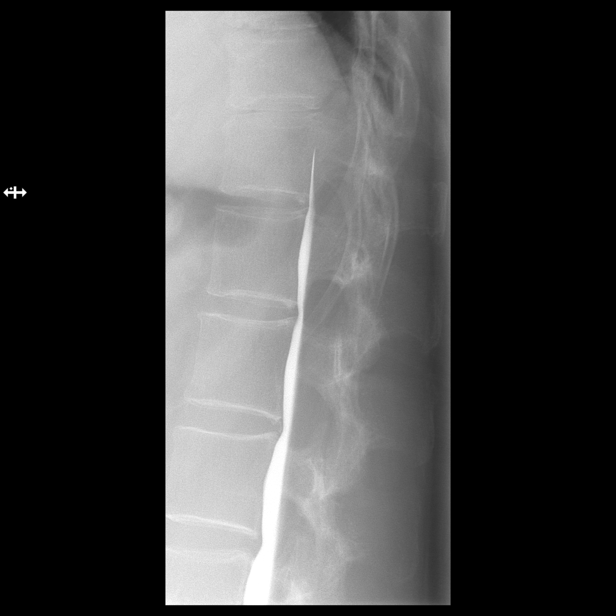

[Series 8: vasc adipose · 1 of 1 slices shown (5 of 12)]
[im 1/1]
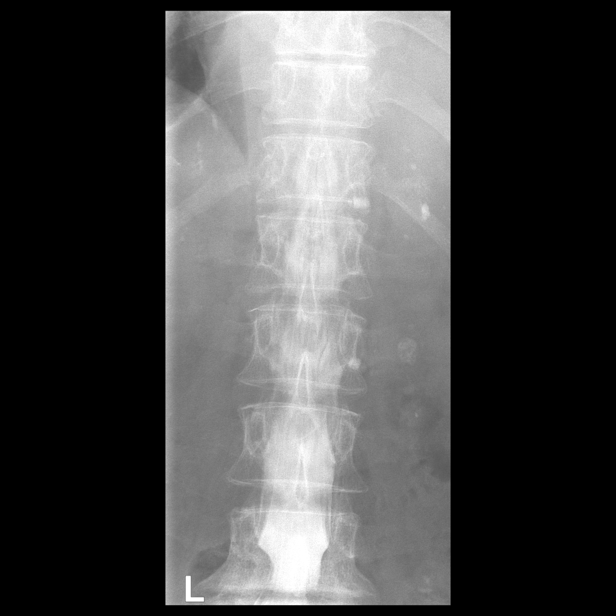

[Series 10: vasc adipose · 1 of 1 slices shown (6 of 12)]
[im 1/1]
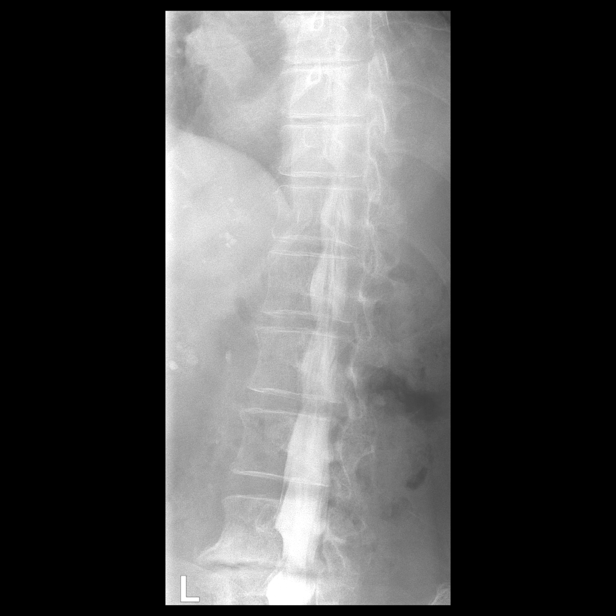

[Series 12: vasc adipose · 1 of 1 slices shown (7 of 12)]
[im 1/1]
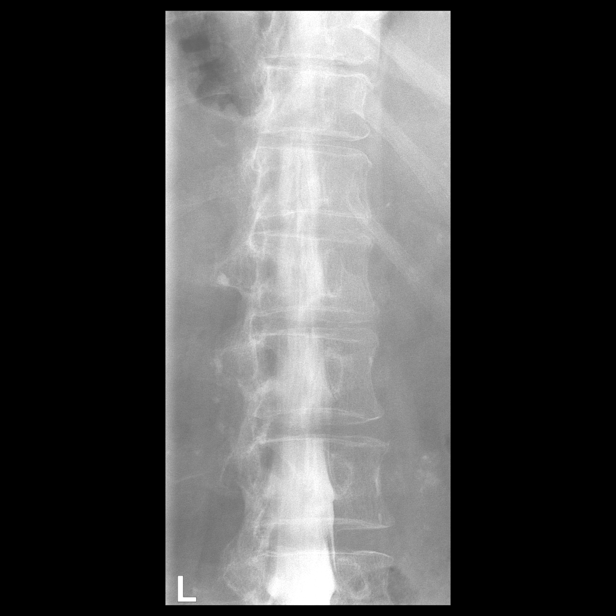

[Series 14: vasc adipose · 1 of 1 slices shown (8 of 12)]
[im 1/1]
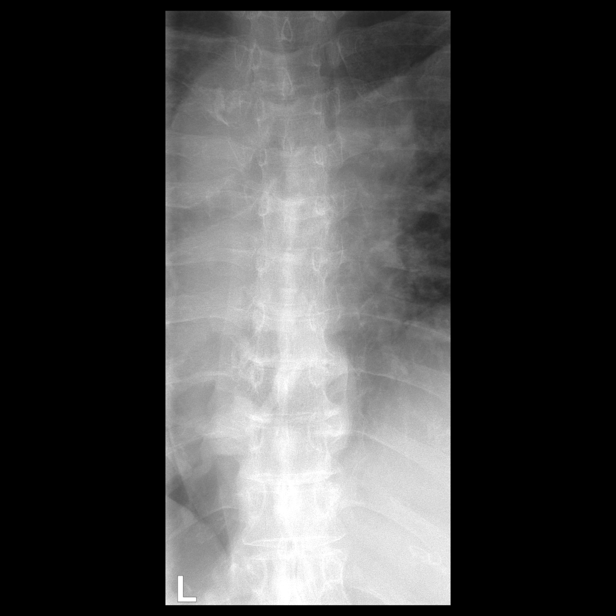

[Series 15: vasc adipose · 1 of 1 slices shown (9 of 12)]
[im 1/1]
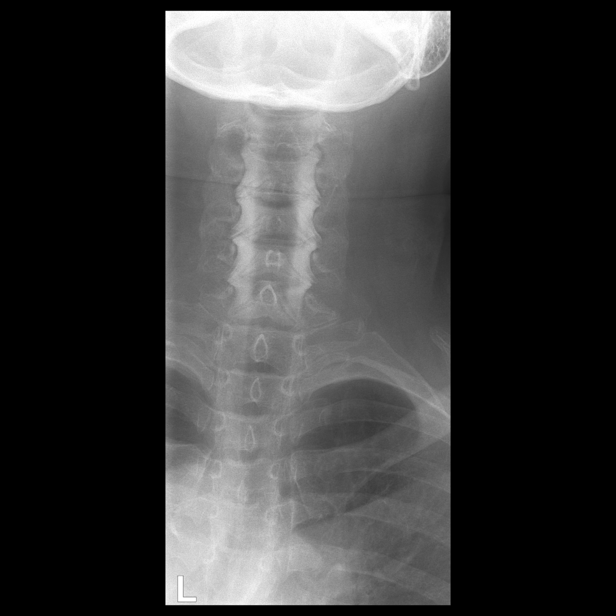

[Series 17: vasc adipose · 1 of 1 slices shown (10 of 12)]
[im 1/1]
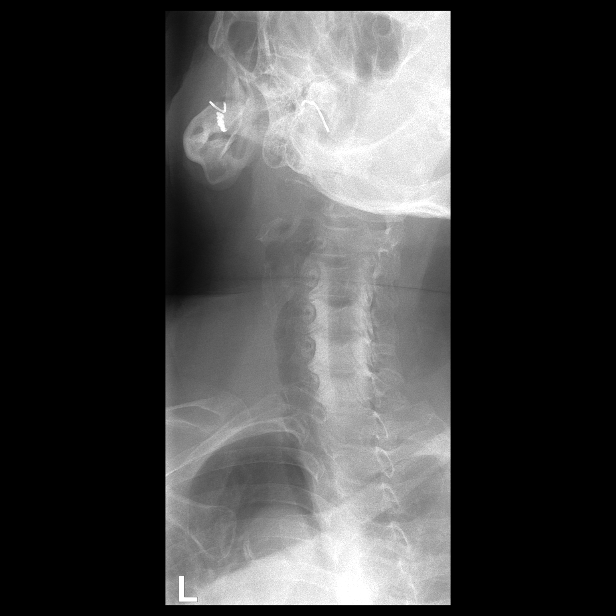

[Series 19: vasc adipose · 1 of 1 slices shown (11 of 12)]
[im 1/1]
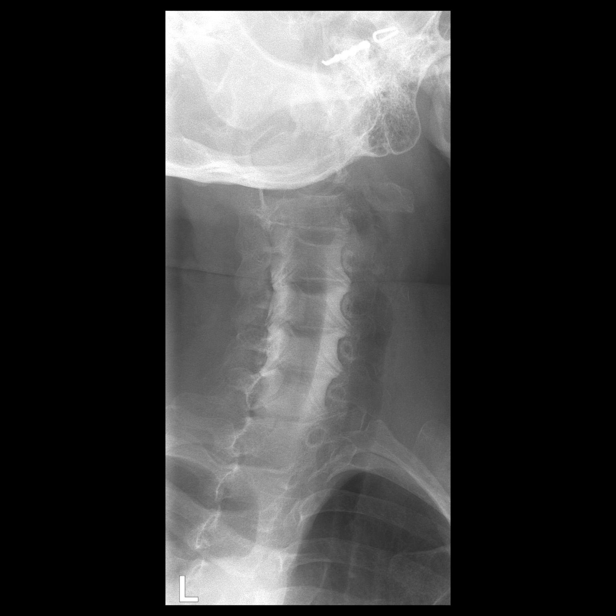

[Series 21: vasc adipose · 1 of 1 slices shown (12 of 12)]
[im 1/1]
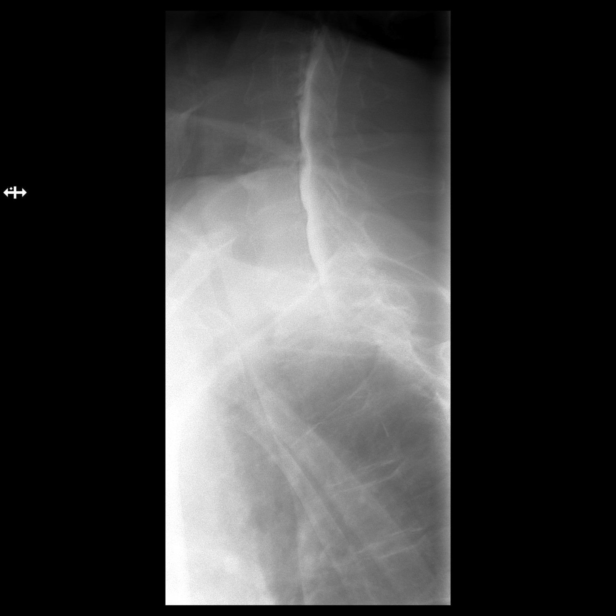

[12 of 21 positions shown; findings below may reference images not displayed]

PROCEDURE:
LUMBAR PUNCTURE FOR CERVICAL LUMBAR AND THORACIC MYELOGRAM

CERVICAL AND LUMBAR AND THORACIC MYELOGRAM

CT CERVICAL MYELOGRAM

CT LUMBAR MYELOGRAM

CT THORACIC MYELOGRAM

After thorough discussion of risks and benefits of the procedure
including bleeding, infection, injury to nerves, blood vessels,
adjacent structures as well as headache and CSF leak, written and
oral informed consent was obtained. Consent was obtained by Dr. TIGER
Lien.

Patient was positioned prone on the fluoroscopy table. Local
anesthesia was provided with 1% lidocaine without epinephrine after
prepped and draped in the usual sterile fashion. Puncture was
performed at L4 using a 3.5 inch 22-gauge spinal needle via left
parasagittal approach. Using a single pass through the dura, the
needle was placed within the thecal sac, with return of clear CSF. 8
mL of Vmnipaque-QVV was injected into the thecal sac, with normal
opacification of the nerve roots and cauda equina consistent with
free flow within the subarachnoid space.

I personally performed the lumbar puncture and administered the
intrathecal contrast. I also personally supervised acquisition of
the myelogram images.

FLUOROSCOPY TIME:  53 seconds; 227 u0ymP DAP
FINDINGS: CERVICAL  :

Normal alignment. Negative for fracture. No prevertebral soft tissue
swelling. Bilateral calcified carotid bifurcation plaque.

No epidural or extraforaminal contrast to suggest CSF leak.

Degenerative spurring and cartilage narrowing at the atlantoaxial
articulation.

C2-3: Negative

C3-4: Small partially calcified central protrusion. No stenosis,
cord distortion or flattening. Foramina patent.

C4-5: Negative

C5-6: Mild narrowing of the interspace. Mild bulge with small
anterior and posterior endplate spurs. Minimal anterior cord
flattening. No spinal stenosis. Foramina patent.

C6-7: Negative

C7-T1: Negative

THORACIC :

Normal alignment. No fracture. 12 rib-bearing thoracic segments
assigned T1-T12. Coronary calcifications. Small hiatal hernia noted.
Noncalcified stones in the nondilated gallbladder, partially
visualized.

No epidural or extraforaminal contrast to suggest CSF leak.

T1-2: Negative

T2-3: Negative

T3-4: Negative

T4-5: Negative

T5-6: Mild degenerative disc disease and endplate spurring
anteriorly. No spinal or foraminal stenosis.

T6-7: Early degenerative changes in the endplates. Small anterior
endplate spurring. No spinal or foraminal stenosis.

T7-8: Schmorl's node in the inferior endplate of T7. No herniation.
No spinal or foraminal stenosis.

T8-9: Vacuum phenomenon in the interspace with small Schmorl's nodes
in the adjacent endplates. Minimal disc bulge. No cord flattening,
distortion, spinal or foraminal stenosis.

T9-10: Mild vacuum phenomenon in the interspace with anterior
endplate spurs. Minimal posterior bulge. No cord flattening,
distortion, spinal or foraminal stenosis.

T10-11: Vacuum phenomena in the in interspace. Anterior endplate
spurring. No spinal or foraminal stenosis.

T11-12: Early vacuum phenomenon in the interspace. No spinal or
foraminal stenosis.

T12-L1: Negative

LUMBAR  :

5 non rib-bearing lumbar segments assigned L1-L5. Normal alignment.
Negative for fracture.

Aortoiliac Aortic Atherosclerosis (Z5WTZ-170.0).

No epidural or extraforaminal contrast to suggest CSF leak.

L1-2: Minimal disc bulge. No spinal or foraminal stenosis. Normal
conus behind L2.

L2-3: Minimal central disc bulge.  No spinal or foraminal stenosis.

L3-4: Mild circumferential disc bulge. No spinal or foraminal
stenosis.

L4-5: Moderate narrowing of the interspace with vacuum phenomenon,
and small Schmorl's nodes in the adjacent endplates. Moderate
circumferential disc bulge with associated endplate spurring. Mild
facet DJD with some thickening of the ligamentum flavum resulting in
mild central canal stenosis and bilateral subarticular recess
encroachment. Foramina remain patent.

L5-S1: Small central bulge. No spinal or foraminal stenosis. Early
left facet DJD.
IMPRESSION: 1. No  evidence of CSF leak.
2. Mild disc bulge C5-6 with early anterior cord flattening, no
stenosis.
3. Mild spondylitic changes in the thoracic spine without
compressive pathology.
4. Disc bulges at all lumbar levels, with mild multifactorial spinal
stenosis and bilateral subarticular recess narrowing L4-5.

5. Cholelithiasis
6. Coronary and aortoiliac  atherosclerosis (Z5WTZ-170.0).
7. Hiatal hernia.

## 2020-12-16 ENCOUNTER — Ambulatory Visit (INDEPENDENT_AMBULATORY_CARE_PROVIDER_SITE_OTHER): Payer: Medicare Other | Admitting: Podiatry

## 2020-12-16 ENCOUNTER — Encounter: Payer: Self-pay | Admitting: Podiatry

## 2020-12-16 ENCOUNTER — Other Ambulatory Visit: Payer: Self-pay

## 2020-12-16 DIAGNOSIS — M79675 Pain in left toe(s): Secondary | ICD-10-CM | POA: Diagnosis not present

## 2020-12-16 DIAGNOSIS — B351 Tinea unguium: Secondary | ICD-10-CM | POA: Diagnosis not present

## 2020-12-16 DIAGNOSIS — M79674 Pain in right toe(s): Secondary | ICD-10-CM | POA: Diagnosis not present

## 2020-12-16 NOTE — Progress Notes (Signed)
  Subjective:  Patient ID: Cheryl Mcdonald, female    DOB: [birth year],  MRN: [id number]  Chief Complaint  Patient presents with  . routine foot care    Nail trim    80 y.o. female returns for the above complaint.  Patient presents with thickened elongated dystrophic toenails x10.  Painful to touch.  She is not a diabetic.  She would like for the nail to be debrided down as she is not able to do it herself.  She denies any other acute complaints.  Objective:  There were no vitals filed for this visit. Podiatric Exam: Vascular: dorsalis pedis and posterior tibial pulses are palpable bilateral. Capillary return is immediate. Temperature gradient is WNL. Skin turgor WNL  Sensorium: Normal Semmes Weinstein monofilament test. Normal tactile sensation bilaterally. Nail Exam: Pt has thick disfigured discolored nails with subungual debris noted bilateral entire nail hallux through fifth toenails.  Pain on palpation to the nails. Ulcer Exam: There is no evidence of ulcer or pre-ulcerative changes or infection. Orthopedic Exam: Muscle tone and strength are WNL. No limitations in general ROM. No crepitus or effusions noted. HAV  B/L.  Hammer toes 2-5  B/L. Skin: No Porokeratosis. No infection or ulcers    Assessment & Plan:   1. Pain due to onychomycosis of toenails of both feet     Patient was evaluated and treated and all questions answered.  Onychomycosis with pain  -Nails palliatively debrided as below. -Educated on self-care  Procedure: Nail Debridement Rationale: pain  Type of Debridement: manual, sharp debridement. Instrumentation: Nail nipper, rotary burr. Number of Nails: 10  Procedures and Treatment: Consent by patient was obtained for treatment procedures. The patient understood the discussion of treatment and procedures well. All questions were answered thoroughly reviewed. Debridement of mycotic and hypertrophic toenails, 1 through 5 bilateral and clearing of subungual debris.  No ulceration, no infection noted.  Return Visit-Office Procedure: Patient instructed to return to the office for a follow up visit 3 months for continued evaluation and treatment.  Nicholes Rough, DPM    No follow-ups on file.

## 2021-02-07 ENCOUNTER — Other Ambulatory Visit: Payer: Self-pay | Admitting: Neurology

## 2021-03-17 ENCOUNTER — Ambulatory Visit: Payer: Medicare Other | Admitting: Podiatry

## 2021-03-19 ENCOUNTER — Ambulatory Visit (INDEPENDENT_AMBULATORY_CARE_PROVIDER_SITE_OTHER): Payer: Medicare Other | Admitting: Podiatry

## 2021-03-19 ENCOUNTER — Other Ambulatory Visit: Payer: Self-pay

## 2021-03-19 DIAGNOSIS — M79675 Pain in left toe(s): Secondary | ICD-10-CM

## 2021-03-19 DIAGNOSIS — B351 Tinea unguium: Secondary | ICD-10-CM | POA: Diagnosis not present

## 2021-03-19 DIAGNOSIS — M79674 Pain in right toe(s): Secondary | ICD-10-CM | POA: Diagnosis not present

## 2021-03-24 ENCOUNTER — Encounter: Payer: Self-pay | Admitting: Podiatry

## 2021-03-24 NOTE — Progress Notes (Signed)
  Subjective:  Patient ID: Cheryl Mcdonald, female    DOB: 07/27/1941,  MRN: [id number]  Chief Complaint  Patient presents with  . Nail Problem    Nail trim    80 y.o. female returns for the above complaint.  Patient presents with thickened elongated dystrophic toenails x10.  Painful to touch.  She is not a diabetic.  She would like for the nail to be debrided down as she is not able to do it herself.  She denies any other acute complaints.  Objective:  There were no vitals filed for this visit. Podiatric Exam: Vascular: dorsalis pedis and posterior tibial pulses are palpable bilateral. Capillary return is immediate. Temperature gradient is WNL. Skin turgor WNL  Sensorium: Normal Semmes Weinstein monofilament test. Normal tactile sensation bilaterally. Nail Exam: Pt has thick disfigured discolored nails with subungual debris noted bilateral entire nail hallux through fifth toenails.  Pain on palpation to the nails. Ulcer Exam: There is no evidence of ulcer or pre-ulcerative changes or infection. Orthopedic Exam: Muscle tone and strength are WNL. No limitations in general ROM. No crepitus or effusions noted. HAV  B/L.  Hammer toes 2-5  B/L. Skin: No Porokeratosis. No infection or ulcers    Assessment & Plan:   1. Pain due to onychomycosis of toenails of both feet     Patient was evaluated and treated and all questions answered.  Onychomycosis with pain  -Nails palliatively debrided as below. -Educated on self-care  Procedure: Nail Debridement Rationale: pain  Type of Debridement: manual, sharp debridement. Instrumentation: Nail nipper, rotary burr. Number of Nails: 10  Procedures and Treatment: Consent by patient was obtained for treatment procedures. The patient understood the discussion of treatment and procedures well. All questions were answered thoroughly reviewed. Debridement of mycotic and hypertrophic toenails, 1 through 5 bilateral and clearing of subungual debris. No  ulceration, no infection noted.  Return Visit-Office Procedure: Patient instructed to return to the office for a follow up visit 3 months for continued evaluation and treatment.  Nicholes Rough, DPM    No follow-ups on file.

## 2021-06-23 ENCOUNTER — Encounter: Payer: Self-pay | Admitting: Podiatry

## 2021-06-23 ENCOUNTER — Ambulatory Visit (INDEPENDENT_AMBULATORY_CARE_PROVIDER_SITE_OTHER): Payer: Medicare Other | Admitting: Podiatry

## 2021-06-23 ENCOUNTER — Other Ambulatory Visit: Payer: Self-pay

## 2021-06-23 DIAGNOSIS — M79675 Pain in left toe(s): Secondary | ICD-10-CM

## 2021-06-23 DIAGNOSIS — B351 Tinea unguium: Secondary | ICD-10-CM

## 2021-06-23 DIAGNOSIS — M79674 Pain in right toe(s): Secondary | ICD-10-CM

## 2021-06-26 NOTE — Progress Notes (Signed)
Subjective: Cheryl Mcdonald is a pleasant 80 y.o. female patient seen today painful thick toenails that are difficult to trim. Pain interferes with ambulation. Aggravating factors include wearing enclosed shoe gear. Pain is relieved with periodic professional debridement.  PCP is Kaleen Mask, MD. She voices no new pedal problems on today's visit.  Allergies  Allergen Reactions   Topiramate Other (See Comments)    Worsening confusion and moodiness.    Objective: Physical Exam  General: Cheryl Mcdonald is a pleasant 80 y.o. Caucasian female, in NAD. AAO x 3.   Vascular:  Capillary refill time to digits immediate b/l. Palpable pedal pulses b/l LE. Pedal hair sparse. Lower extremity skin temperature gradient within normal limits.  Dermatological:  Pedal skin with normal turgor, texture and tone b/l lower extremities. No open wounds b/l lower extremities. No interdigital macerations b/l lower extremities. Toenails 1-5 b/l elongated, discolored, dystrophic, thickened, crumbly with subungual debris and tenderness to dorsal palpation.  Musculoskeletal:  Normal muscle strength 5/5 to all lower extremity muscle groups bilaterally. 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 Hammertoe(s) noted to the 2-5 bilaterally.  Neurological:  Protective sensation intact 5/5 intact bilaterally with 10g monofilament b/l.  Assessment and Plan:  1. Pain due to onychomycosis of toenails of both feet      -Examined patient. -No new findings. No new orders. -Patient to continue soft, supportive shoe gear daily. -Toenails 1-5 b/l were debrided in length and girth with sterile nail nippers and dremel without iatrogenic bleeding.  -Patient to report any pedal injuries to medical professional immediately. -Patient/POA to call should there be question/concern in the interim.  Return in about 3 months (around 09/23/2021).  Freddie Breech, DPM

## 2021-09-29 ENCOUNTER — Ambulatory Visit: Payer: Medicare Other | Admitting: Podiatry
